# Patient Record
Sex: Male | Born: 1940
Health system: Southern US, Community
[De-identification: ages and names within clinical notes are randomized; demographics above are authoritative.]

## PROBLEM LIST (undated history)

## (undated) DIAGNOSIS — J358 Other chronic diseases of tonsils and adenoids: Secondary | ICD-10-CM

## (undated) DIAGNOSIS — K219 Gastro-esophageal reflux disease without esophagitis: Secondary | ICD-10-CM

## (undated) DIAGNOSIS — I10 Essential (primary) hypertension: Secondary | ICD-10-CM

## (undated) DIAGNOSIS — F1010 Alcohol abuse, uncomplicated: Secondary | ICD-10-CM

## (undated) DIAGNOSIS — N200 Calculus of kidney: Secondary | ICD-10-CM

## (undated) DIAGNOSIS — R51 Headache: Secondary | ICD-10-CM

## (undated) DIAGNOSIS — R0989 Other specified symptoms and signs involving the circulatory and respiratory systems: Secondary | ICD-10-CM

## (undated) DIAGNOSIS — C4491 Basal cell carcinoma of skin, unspecified: Secondary | ICD-10-CM

## (undated) DIAGNOSIS — Z973 Presence of spectacles and contact lenses: Secondary | ICD-10-CM

## (undated) DIAGNOSIS — E209 Hypoparathyroidism, unspecified: Secondary | ICD-10-CM

## (undated) DIAGNOSIS — E785 Hyperlipidemia, unspecified: Secondary | ICD-10-CM

## (undated) DIAGNOSIS — M1990 Unspecified osteoarthritis, unspecified site: Secondary | ICD-10-CM

## (undated) DIAGNOSIS — E039 Hypothyroidism, unspecified: Secondary | ICD-10-CM

## (undated) DIAGNOSIS — I639 Cerebral infarction, unspecified: Secondary | ICD-10-CM

## (undated) DIAGNOSIS — R519 Headache, unspecified: Secondary | ICD-10-CM

## (undated) DIAGNOSIS — N400 Enlarged prostate without lower urinary tract symptoms: Secondary | ICD-10-CM

## (undated) DIAGNOSIS — I6529 Occlusion and stenosis of unspecified carotid artery: Secondary | ICD-10-CM

## (undated) DIAGNOSIS — H269 Unspecified cataract: Secondary | ICD-10-CM

## (undated) DIAGNOSIS — K5792 Diverticulitis of intestine, part unspecified, without perforation or abscess without bleeding: Secondary | ICD-10-CM

## (undated) HISTORY — DX: Benign prostatic hyperplasia without lower urinary tract symptoms: N40.0

## (undated) HISTORY — DX: Gastro-esophageal reflux disease without esophagitis: K21.9

## (undated) HISTORY — DX: Essential (primary) hypertension: I10

## (undated) HISTORY — DX: Other specified symptoms and signs involving the circulatory and respiratory systems: R09.89

## (undated) HISTORY — PX: COLONOSCOPY W/ BIOPSIES AND POLYPECTOMY: SHX1376

## (undated) HISTORY — DX: Hyperlipidemia, unspecified: E78.5

## (undated) HISTORY — DX: Diverticulitis of intestine, part unspecified, without perforation or abscess without bleeding: K57.92

## (undated) HISTORY — DX: Calculus of kidney: N20.0

## (undated) HISTORY — PX: OTHER SURGICAL HISTORY: SHX169

## (undated) HISTORY — DX: Other chronic diseases of tonsils and adenoids: J35.8

## (undated) HISTORY — DX: Hypothyroidism, unspecified: E03.9

## (undated) HISTORY — DX: Basal cell carcinoma of skin, unspecified: C44.91

## (undated) HISTORY — DX: Alcohol abuse, uncomplicated: F10.10

## (undated) HISTORY — PX: TONSILLECTOMY: SUR1361

## (undated) HISTORY — PX: CAROTID ENDARTERECTOMY: SUR193

## (undated) HISTORY — PX: CHOLECYSTECTOMY: SHX55

## (undated) HISTORY — DX: Hypoparathyroidism, unspecified: E20.9

---

## 1985-04-10 HISTORY — PX: HYDROCELE EXCISION / REPAIR: SUR1145

## 1989-04-10 DIAGNOSIS — F1010 Alcohol abuse, uncomplicated: Secondary | ICD-10-CM

## 1989-04-10 HISTORY — DX: Alcohol abuse, uncomplicated: F10.10

## 2000-04-10 DIAGNOSIS — R0989 Other specified symptoms and signs involving the circulatory and respiratory systems: Secondary | ICD-10-CM

## 2000-04-10 HISTORY — DX: Other specified symptoms and signs involving the circulatory and respiratory systems: R09.89

## 2002-04-10 DIAGNOSIS — E209 Hypoparathyroidism, unspecified: Secondary | ICD-10-CM

## 2002-04-10 HISTORY — DX: Hypoparathyroidism, unspecified: E20.9

## 2004-04-10 HISTORY — PX: KIDNEY STONE SURGERY: SHX686

## 2006-09-25 ENCOUNTER — Ambulatory Visit: Payer: Self-pay | Admitting: Internal Medicine

## 2006-09-25 DIAGNOSIS — N400 Enlarged prostate without lower urinary tract symptoms: Secondary | ICD-10-CM

## 2006-09-25 DIAGNOSIS — Z8719 Personal history of other diseases of the digestive system: Secondary | ICD-10-CM | POA: Insufficient documentation

## 2006-09-25 DIAGNOSIS — K219 Gastro-esophageal reflux disease without esophagitis: Secondary | ICD-10-CM

## 2006-09-25 DIAGNOSIS — E785 Hyperlipidemia, unspecified: Secondary | ICD-10-CM | POA: Insufficient documentation

## 2006-09-25 DIAGNOSIS — Z87442 Personal history of urinary calculi: Secondary | ICD-10-CM | POA: Insufficient documentation

## 2006-11-05 ENCOUNTER — Telehealth (INDEPENDENT_AMBULATORY_CARE_PROVIDER_SITE_OTHER): Payer: Self-pay | Admitting: *Deleted

## 2006-12-05 ENCOUNTER — Ambulatory Visit: Payer: Self-pay | Admitting: Internal Medicine

## 2006-12-24 ENCOUNTER — Ambulatory Visit: Payer: Self-pay | Admitting: Internal Medicine

## 2006-12-24 LAB — CONVERTED CEMR LAB
Bilirubin Urine: NEGATIVE
Glucose, Urine, Semiquant: NEGATIVE
Protein, U semiquant: NEGATIVE
Specific Gravity, Urine: >=1.03
Urobilinogen, UA: NEGATIVE
WBC Urine, dipstick: NEGATIVE

## 2006-12-25 LAB — CONVERTED CEMR LAB
AST: 22 units/L (ref 0–37)
Free T4: 0.9 ng/dL (ref 0.6–1.6)
HDL: 41.8 mg/dL (ref >39.0)
LDL Cholesterol: 107 mg/dL — ABNORMAL HIGH (ref 0–99)
PSA: 0.86 ng/mL (ref 0.10–4.00)
VLDL: 28 mg/dL (ref 0–40)

## 2007-05-13 ENCOUNTER — Ambulatory Visit: Payer: Self-pay | Admitting: Internal Medicine

## 2007-06-24 ENCOUNTER — Ambulatory Visit: Payer: Self-pay | Admitting: Internal Medicine

## 2007-06-24 DIAGNOSIS — E039 Hypothyroidism, unspecified: Secondary | ICD-10-CM

## 2007-06-26 LAB — CONVERTED CEMR LAB: TSH: 1.68 microintl units/mL (ref 0.35–5.50)

## 2007-12-13 ENCOUNTER — Ambulatory Visit: Payer: Self-pay | Admitting: Internal Medicine

## 2007-12-13 DIAGNOSIS — E213 Hyperparathyroidism, unspecified: Secondary | ICD-10-CM

## 2007-12-13 LAB — CONVERTED CEMR LAB
ALT: 30 units/L (ref 0–53)
AST: 30 units/L (ref 0–37)
Basophils Absolute: 0 10*3/uL (ref 0.0–0.1)
Basophils Relative: 0.8 % (ref 0.0–3.0)
CO2: 27 meq/L (ref 19–32)
Calcium: 9.4 mg/dL
Chloride, Serum: 108 mmol/L
Chloride: 108 meq/L (ref 96–112)
Cholesterol: 188 mg/dL
Creatinine, Ser: 1.1 mg/dL
Glucose, Bld: 116 mg/dL
Glucose, Bld: 116 mg/dL — ABNORMAL HIGH (ref 70–99)
HDL: 38.2 mg/dL
HDL: 38.2 mg/dL — ABNORMAL LOW (ref >39.0)
LDL Cholesterol: 61 mg/dL
Lymphocytes Relative: 24.3 % (ref 12.0–46.0)
MCHC: 35.8 g/dL (ref 30.0–36.0)
Monocytes Relative: 8.8 % (ref 3.0–12.0)
Neutrophils Relative %: 64.6 % (ref 43.0–77.0)
PSA: 1.28 ng/mL (ref 0.10–4.00)
RBC: 4.68 M/uL (ref 4.22–5.81)
RDW: 12 % (ref 11.5–14.6)
Sodium: 142 meq/L (ref 135–145)
TSH: 1.04 microintl units/mL
TSH: 1.04 microintl units/mL (ref 0.35–5.50)
Total CHOL/HDL Ratio: 4.9
VLDL: 61 mg/dL — ABNORMAL HIGH (ref 0–40)

## 2007-12-17 ENCOUNTER — Ambulatory Visit: Payer: Self-pay | Admitting: Internal Medicine

## 2007-12-17 LAB — CONVERTED CEMR LAB
Basophils Absolute: 0 10*3/uL (ref 0.0–0.1)
Basophils Relative: 0.8 % (ref 0.0–3.0)
Eosinophils Absolute: 0.1 10*3/uL (ref 0.0–0.7)
Eosinophils Relative: 1.5 % (ref 0.0–5.0)
HCT: 45.2 % (ref 39.0–52.0)
Hemoglobin: 16.2 g/dL (ref 13.0–17.0)
MCHC: 35.8 g/dL (ref 30.0–36.0)
MCV: 96.5 fL (ref 78.0–100.0)
Monocytes Absolute: 0.4 10*3/uL (ref 0.1–1.0)
Neutro Abs: 2.9 10*3/uL (ref 1.4–7.7)
RBC: 4.68 M/uL (ref 4.22–5.81)
WBC: 4.5 10*3/uL (ref 4.5–10.5)

## 2007-12-18 ENCOUNTER — Encounter (INDEPENDENT_AMBULATORY_CARE_PROVIDER_SITE_OTHER): Payer: Self-pay | Admitting: *Deleted

## 2007-12-23 ENCOUNTER — Encounter: Payer: Self-pay | Admitting: Internal Medicine

## 2007-12-24 ENCOUNTER — Telehealth (INDEPENDENT_AMBULATORY_CARE_PROVIDER_SITE_OTHER): Payer: Self-pay | Admitting: *Deleted

## 2007-12-25 ENCOUNTER — Encounter (INDEPENDENT_AMBULATORY_CARE_PROVIDER_SITE_OTHER): Payer: Self-pay | Admitting: *Deleted

## 2008-01-06 ENCOUNTER — Ambulatory Visit: Payer: Self-pay | Admitting: Family Medicine

## 2008-01-06 ENCOUNTER — Encounter: Payer: Self-pay | Admitting: Internal Medicine

## 2008-05-20 ENCOUNTER — Ambulatory Visit: Payer: Self-pay | Admitting: Internal Medicine

## 2008-05-20 ENCOUNTER — Encounter (INDEPENDENT_AMBULATORY_CARE_PROVIDER_SITE_OTHER): Payer: Self-pay | Admitting: *Deleted

## 2008-06-11 ENCOUNTER — Ambulatory Visit: Payer: Self-pay | Admitting: Internal Medicine

## 2008-06-11 DIAGNOSIS — I10 Essential (primary) hypertension: Secondary | ICD-10-CM | POA: Insufficient documentation

## 2008-06-11 DIAGNOSIS — R7309 Other abnormal glucose: Secondary | ICD-10-CM | POA: Insufficient documentation

## 2008-06-16 ENCOUNTER — Telehealth (INDEPENDENT_AMBULATORY_CARE_PROVIDER_SITE_OTHER): Payer: Self-pay | Admitting: *Deleted

## 2008-06-16 LAB — CONVERTED CEMR LAB
Glucose, Bld: 121 mg/dL — ABNORMAL HIGH (ref 70–99)
Hgb A1c MFr Bld: 5.4 % (ref 4.6–6.0)
LDL Cholesterol: 85 mg/dL (ref 0–99)
Total CHOL/HDL Ratio: 4.6
VLDL: 38 mg/dL (ref 0–40)

## 2008-12-18 ENCOUNTER — Ambulatory Visit: Payer: Self-pay | Admitting: Internal Medicine

## 2008-12-23 LAB — CONVERTED CEMR LAB
BUN: 13 mg/dL (ref 6–23)
Calcium: 9.3 mg/dL (ref 8.4–10.5)
Creatinine, Ser: 1.1 mg/dL (ref 0.4–1.5)
GFR calc non Af Amer: 70.38 mL/min (ref >60)
Glucose, Bld: 109 mg/dL — ABNORMAL HIGH (ref 70–99)
HDL: 37.3 mg/dL — ABNORMAL LOW (ref >39.00)
Hemoglobin: 16.4 g/dL (ref 13.0–17.0)
PSA: 1.07 ng/mL (ref 0.10–4.00)

## 2009-03-02 ENCOUNTER — Encounter (INDEPENDENT_AMBULATORY_CARE_PROVIDER_SITE_OTHER): Payer: Self-pay | Admitting: *Deleted

## 2009-06-18 ENCOUNTER — Ambulatory Visit: Payer: Self-pay | Admitting: Internal Medicine

## 2009-07-01 ENCOUNTER — Ambulatory Visit: Payer: Self-pay | Admitting: Internal Medicine

## 2010-01-21 ENCOUNTER — Ambulatory Visit: Payer: Self-pay | Admitting: Internal Medicine

## 2010-01-24 ENCOUNTER — Encounter (INDEPENDENT_AMBULATORY_CARE_PROVIDER_SITE_OTHER): Payer: Self-pay | Admitting: *Deleted

## 2010-01-27 LAB — CONVERTED CEMR LAB
BUN: 13 mg/dL (ref 6–23)
CO2: 21 meq/L (ref 19–32)
Chloride: 108 meq/L (ref 96–112)
Cholesterol: 181 mg/dL (ref 0–200)
Eosinophils Absolute: 0.1 10*3/uL (ref 0.0–0.7)
Eosinophils Relative: 1.3 % (ref 0.0–5.0)
GFR calc non Af Amer: 70.16 mL/min (ref >60)
Glucose, Bld: 98 mg/dL (ref 70–99)
HCT: 46.6 % (ref 39.0–52.0)
HDL: 40.3 mg/dL (ref >39.00)
LDL Cholesterol: 110 mg/dL — ABNORMAL HIGH (ref 0–99)
Lymphs Abs: 1.1 10*3/uL (ref 0.7–4.0)
MCHC: 35.2 g/dL (ref 30.0–36.0)
MCV: 94.7 fL (ref 78.0–100.0)
Monocytes Absolute: 0.4 10*3/uL (ref 0.1–1.0)
Neutrophils Relative %: 65.3 % (ref 43.0–77.0)
Platelets: 131 10*3/uL — ABNORMAL LOW (ref 150.0–400.0)
Potassium: 3.8 meq/L (ref 3.5–5.1)
Sodium: 139 meq/L (ref 135–145)
TSH: 3.69 microintl units/mL (ref 0.35–5.50)
Triglycerides: 152 mg/dL — ABNORMAL HIGH (ref 0.0–149.0)
VLDL: 30.4 mg/dL (ref 0.0–40.0)

## 2010-04-12 ENCOUNTER — Encounter (INDEPENDENT_AMBULATORY_CARE_PROVIDER_SITE_OTHER): Payer: Self-pay | Admitting: *Deleted

## 2010-05-06 ENCOUNTER — Ambulatory Visit
Admission: RE | Admit: 2010-05-06 | Discharge: 2010-05-06 | Payer: Self-pay | Source: Home / Self Care | Attending: Internal Medicine | Admitting: Internal Medicine

## 2010-05-06 DIAGNOSIS — J069 Acute upper respiratory infection, unspecified: Secondary | ICD-10-CM | POA: Insufficient documentation

## 2010-05-09 ENCOUNTER — Encounter (INDEPENDENT_AMBULATORY_CARE_PROVIDER_SITE_OTHER): Payer: Self-pay

## 2010-05-10 ENCOUNTER — Ambulatory Visit
Admission: RE | Admit: 2010-05-10 | Discharge: 2010-05-10 | Payer: Self-pay | Source: Home / Self Care | Attending: Gastroenterology | Admitting: Gastroenterology

## 2010-05-11 NOTE — Letter (Signed)
Summary: Pre Visit Letter Revised  [facility]  [address] [place], [place] [address]   Phone: [phone]  Fax: [phone]        01/24/2010 MRN: [id number] Damon Foley [address] [place], [place]  [address]             Procedure Date:  03-08-10   Welcome to the Gastroenterology Division at [facility].    You are scheduled to see a nurse for your pre-procedure visit on 02-22-10 at 10:00a.m. on the 3rd floor at [facility], [address].  We ask that you try to arrive at our office 15 minutes prior to your appointment time to allow for check-in.  Please take a minute to review the attached form.  If you answer "Yes" to one or more of the questions on the first page, we ask that you call the person listed at your earliest opportunity.  If you answer "No" to all of the questions, please complete the rest of the form and bring it to your appointment.    Your nurse visit will consist of discussing your medical and surgical history, your immediate family medical history, and your medications.   If you are unable to list all of your medications on the form, please bring the medication bottles to your appointment and we will list them.  We will need to be aware of both prescribed and over the counter drugs.  We will need to know exact dosage information as well.    Please be prepared to read and sign documents such as consent forms, a financial agreement, and acknowledgement forms.  If necessary, and with your consent, a friend or relative is welcome to sit-in on the nurse visit with you.  Please bring your insurance card so that we may make a copy of it.  If your insurance requires a referral to see a specialist, please bring your referral form from your primary care physician.  No co-pay is required for this nurse visit.     If you cannot keep your appointment, please call [phone] to cancel or reschedule prior to your appointment  date.  This allows [place] the opportunity to schedule an appointment for another patient in need of care.    Thank you for choosing [facility] for your medical needs.  We appreciate the opportunity to care for you.  Please visit [place] at our website  to learn more about our practice.  Sincerely, The Gastroenterology Division

## 2010-05-11 NOTE — Assessment & Plan Note (Signed)
Summary: ear wax/swh   Vital Signs:  Patient profile:   70 year old male Height:      71 inches Weight:      225.4 pounds BMI:     31.55 BP sitting:   130 / 74  Vitals Entered By: Shary Decamp (July 01, 2009 9:24 AM) CC: pt was fitted for hearing aids yesterday -- ear wax left ear Comments rt ear - clear lots of wax in left ear -- irrigated left ear  pt has no concerns Shary Decamp  July 01, 2009 9:25 AM    Allergies: No Known Drug Allergies   Complete Medication List: 1)  Levoxyl 125 Mcg Tabs (Levothyroxine sodium) .Marland Kitchen.. 1 by mouth once daily 2)  Atenolol 50 Mg Tabs (Atenolol) .Marland Kitchen.. 1 by mouth once daily 3)  Simvastatin 40 Mg Tabs (Simvastatin) .Marland Kitchen.. 1 by mouth once daily 4)  Ranitidine Hcl 150 Mg Caps (Ranitidine hcl) .Marland Kitchen.. 1 by mouth once daily as needed 5)  Uroxatral 10 Mg Tb24 (Alfuzosin hcl) .... One p.o. by mouth daily  Other Orders: Cerumen Impaction Removal (11914)

## 2010-05-11 NOTE — Assessment & Plan Note (Signed)
Summary: cpx/kdc   Vital Signs:  Patient profile:   70 year old male Height:      71 inches Weight:      238.50 pounds Pulse rate:   82 / minute Pulse rhythm:   regular BP sitting:   128 / 82  (left arm) Cuff size:   large  Vitals Entered By: Army Fossa CMA (January 21, 2010 12:42 PM) CC: CPX, fasting (had coffee only)  Comments will get flu shot @ work Wants rx's printed.    History of Present Illness: Here for Medicare AWV:  1.   Risk factors based on Past M, S, F history: reviewed  2.   Physical Activities: works x3 /week, active , no routine exercise  3.   Depression/mood:  no problems, no issues noted  4.   Hearing: has hearing aids, helping a lot  5.   ADL's: totally independent 6.   Fall Risk: no recent falls, low risk  7.   Home Safety: feels safe at home  8.   Height, weight, &visual acuity: see VS,  vision wel corrected w/  glasses  9.   Counseling: yes  10.   Labs ordered based on risk factors: yes  11.           Referral Coordination, if needed  12.           Care Plan, see a/p  13.            Cognitive Assessment , motor skills, memory and cognition seems within normal  in addition, we discussed the following issues  thyroid, due for labs  HTN -- no ambulatory BPs , good medication compliance  Hyperlipidemia-- good medication compliance, no s/e -myalgias that he can tell Benign prostatic hypertrophy-- nocturia x 2 , for years, symptoms stable     Current Medications (verified): 1)  Levoxyl 125 Mcg  Tabs (Levothyroxine Sodium) .Marland Kitchen.. 1 By Mouth Once Daily 2)  Atenolol 50 Mg  Tabs (Atenolol) .Marland Kitchen.. 1 By Mouth Once Daily 3)  Simvastatin 40 Mg  Tabs (Simvastatin) .Marland Kitchen.. 1 By Mouth Once Daily 4)  Ranitidine Hcl 150 Mg  Caps (Ranitidine Hcl) .Marland Kitchen.. 1 By Mouth Once Daily As Needed 5)  Uroxatral 10 Mg  Tb24 (Alfuzosin Hcl) .... One P.o. By Mouth Daily  Allergies (verified): No Known Drug Allergies  Past History:  Past Medical History: Reviewed history from  06/18/2009 and no changes required. HYPOTHYROIDISM (DX aprox 2004) HTN  GERD Hyperlipidemia Benign prostatic hypertrophy DIVERTICULITIS  h/o hypoparathyroidism RENAL CALCULUS, R sided 2006 h/o ETOH abuse quit 1991 per chart review: R carotid bruit 2002, neg u/s  Past Surgical History: Reviewed history from 12/13/2007 and no changes required. Cholecystectomy Right kidney stone removed - had stent - did not work -  had stone removed 2006 L hydrocele repair 1987  Family History: DM--no MI-- M age 40 colon ca-- no prostate ca-- F age 76 cancer, type? (brother)  Social History: Divorced, lives by self  has one daughter in [place] to [place] in May 2008 from [place] Former Smoker (quit 04/1989) Alcohol use-no (quit 04/1989) Drug use-no Regular exercise-no  Review of Systems General:  Denies fever and weight loss. CV:  Denies chest pain or discomfort and swelling of feet. Resp:  Denies cough and shortness of breath. GI:  Denies bloody stools, diarrhea, nausea, and vomiting. GU:  Denies dysuria and hematuria.  Physical Exam  General:  alert, well-developed, and well-nourished.   Neck:  full ROM, no  masses, no thyromegaly, and normal carotid upstroke.   Lungs:  normal respiratory effort, no intercostal retractions, no accessory muscle use, and normal breath sounds.   Heart:  normal rate, regular rhythm, and no murmur.   Abdomen:  soft, non-tender, no distention, no masses, no guarding, and no rigidity.   Rectal:  No external abnormalities noted. Normal sphincter tone. No rectal masses or tenderness. Prostate:  Prostate gland firm and smooth, no enlargement, nodularity, tenderness, mass, asymmetry or induration. Extremities:  no pretibial edema bilaterally  Psych:  Oriented X3, memory intact for recent and remote, normally interactive, good eye contact, not anxious appearing, and not depressed appearing.     Impression & Recommendations:  Problem # 1:  HEALTH SCREENING  (ICD-V70.0)  TD 07 Pneumonia shot 08 shingles shot? 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 1 by mouth once daily  BP today: 128/82 Prior BP: 130/74 (07/01/2009)  Labs Reviewed: K+: 4.1 (12/18/2008) Creat: : 1.1 (12/18/2008)   Chol: 167 (12/18/2008)   HDL: 37.30 (12/18/2008)   LDL: 85 (06/11/2008)   TG: 229.0 (12/18/2008)  Orders: Venipuncture (60[address]) TLB-BMP (Basic Metabolic Panel-BMET) (80048-METABOL) TLB-CBC Platelet - w/Differential (85025-CBCD) Specimen Handling (09811)  Problem # 3:  Hx of HYPERPARATHYROIDISM UNSPECIFIED (ICD-252.00) recheck a calcium level, PTH if calcium is abnormal  Problem # 4:  HYPERLIPIDEMIA (ICD-272.4) labs  His updated medication list for this problem includes:    Simvastatin 40 Mg Tabs (Simvastatin) .Marland Kitchen... 1 by mouth once daily  Labs Reviewed: SGOT: 22 (12/18/2008)   SGPT: 22 (12/18/2008)   HDL:37.30 (12/18/2008), 34.1 (06/11/2008)  LDL:85 (06/11/2008), 61 (12/13/2007)  Chol:167 (12/18/2008), 157 (06/11/2008)  Trig:229.0 (12/18/2008), 191 (06/11/2008)  Orders: TLB-ALT (SGPT) (84460-ALT) TLB-AST (SGOT) (84450-SGOT) TLB-Lipid Panel (80061-LIPID) Specimen Handling (91478)  Problem # 5:  HYPOTHYROIDISM (ICD-244.9) due for labs His updated medication list for this problem includes:    Levoxyl 125 Mcg Tabs (Levothyroxine sodium) .Marland Kitchen... 1 by mouth once  daily    Labs Reviewed: TSH: 1.40 (12/18/2008)    HgBA1c: 5.4 (06/11/2008) Chol: 167 (12/18/2008)   HDL: 37.30 (12/18/2008)   LDL: 85 (06/11/2008)   TG: 229.0 (12/18/2008)  Orders: TLB-TSH (Thyroid Stimulating Hormone) (84443-TSH)  Complete Medication List: 1)  Levoxyl 125 Mcg Tabs (Levothyroxine sodium) .Marland Kitchen.. 1 by mouth once daily 2)  Atenolol 50 Mg Tabs (Atenolol) .Marland Kitchen.. 1 by mouth once daily 3)  Simvastatin 40 Mg Tabs (Simvastatin) .Marland Kitchen.. 1 by mouth once daily 4)  Ranitidine Hcl 150 Mg Caps (Ranitidine hcl) .Marland Kitchen.. 1 by mouth once daily as needed 5)  Uroxatral 10 Mg Tb24 (Alfuzosin hcl) .... One p.o. by mouth daily  Other Orders: TLB-PSA (Prostate Specific Antigen) (84153-PSA)  Patient Instructions: 1)  Please schedule a follow-up appointment in 6 months .  Prescriptions: UROXATRAL 10 MG  TB24 (ALFUZOSIN HCL) one p.o. by mouth daily  #90 x 3   Entered by:   Army Fossa CMA   Authorized  by:   Nolon Rod Paz MD   Signed by:   Army Fossa CMA on 01/21/2010   Method used:   Print then Give to Patient   RxID:   [id number] RANITIDINE HCL 150 MG  CAPS (RANITIDINE HCL) 1 by mouth once daily as needed  #90 x 3   Entered by:   Army Fossa CMA   Authorized by:   Nolon Rod. Paz MD   Signed by:   Army Fossa CMA on 01/21/2010   Method used:   Print then Give to Patient   RxID:   [id number] SIMVASTATIN 40 MG  TABS (SIMVASTATIN) 1 by mouth once daily  #90 x 3   Entered by:   Army Fossa CMA   Authorized by:   Nolon Rod. Paz MD   Signed by:   Army Fossa CMA on 01/21/2010   Method used:   Print then Give to Patient   RxID:   [id number] ATENOLOL 50 MG  TABS (ATENOLOL) 1 by mouth once daily  #90 x 3   Entered by:   Army Fossa CMA   Authorized by:   Nolon Rod. Paz MD   Signed by:   Army Fossa CMA on 01/21/2010   Method used:   Print then Give to Patient   RxID:   [id number] LEVOXYL 125 MCG  TABS (LEVOTHYROXINE SODIUM) 1 by mouth once daily  #90 x  3   Entered by:   Army Fossa CMA   Authorized by:   Nolon Rod. Paz MD   Signed by:   Army Fossa CMA on 01/21/2010   Method used:   Print then Give to Patient   RxID:   [id number]

## 2010-05-11 NOTE — Assessment & Plan Note (Signed)
Summary: 6 MONTH OV//PH   Vital Signs:  Patient profile:   70 year old male Weight:      225 pounds Pulse rate:   64 / minute BP sitting:   138 / 80  (left arm)  Vitals Entered By: Doristine Devoid (June 18, 2009 9:48 AM) CC: 6 month roa    History of Present Illness: ROV  doing well complaining of  difficulties w/ his hearing  Allergies: No Known Drug Allergies  Past History:  Past Medical History: HYPOTHYROIDISM (DX aprox 2004) HTN  GERD Hyperlipidemia Benign prostatic hypertrophy DIVERTICULITIS  h/o hypoparathyroidism RENAL CALCULUS, R sided 2006 h/o ETOH abuse quit 1991 per chart review: R carotid bruit 2002, neg u/s  Past Surgical History: Reviewed history from 12/13/2007 and no changes required. Cholecystectomy Right kidney stone removed - had stent - did not work -  had stone removed 2006 L hydrocele repair 1987  Social History: Reviewed history from 12/18/2008 and no changes required. Divorced has one daughter. Moved to [facility] in May 2008 from [place] Former Smoker (quit 04/1989) Alcohol use-no (quit 04/1989) Drug use-no Regular exercise-no  Review of Systems        HYPOTHYROIDISM  -- good medication compliance  HTN -- no ambulatory BPs ,has decrerased his  salt intake Hyperlipidemia-- good medication compliance     Physical Exam  General:  alert, well-developed, and well-nourished.   Lungs:  normal respiratory effort, no intercostal retractions, no accessory muscle use, and normal breath sounds.   Heart:  normal rate, regular rhythm, and no murmur.   Extremities:  no pretibial edema bilaterally    Impression & Recommendations:  Problem # 1:  HYPERTENSION (ICD-401.9) at goal  His updated medication list for this problem includes:    Atenolol 50 Mg Tabs (Atenolol) .Marland Kitchen... 1 by mouth once daily  BP today: 138/80 Prior BP: 134/80 (12/18/2008)  Labs Reviewed: K+: 4.1 (12/18/2008) Creat: : 1.1 (12/18/2008)   Chol: 167 (12/18/2008)   HDL:  37.30 (12/18/2008)   LDL: 85 (06/11/2008)   TG: 229.0 (12/18/2008)  Problem # 2:  HYPERLIPIDEMIA (ICD-272.4) I reviewed with the patient  his previous  FLPs, they are okay except for his last triglycerides. Information provided about  healthy diet His updated medication list for this problem includes:    Simvastatin 40 Mg Tabs (Simvastatin) .Marland Kitchen... 1 by mouth once daily  Labs Reviewed: SGOT: 22 (12/18/2008)   SGPT: 22 (12/18/2008)   HDL:37.30 (12/18/2008), 34.1 (06/11/2008)  LDL:85 (06/11/2008), 61 (12/13/2007)  Chol:167 (12/18/2008), 157 (06/11/2008)  Trig:229.0 (12/18/2008), 191 (06/11/2008)  Problem # 3:  HYPOTHYROIDISM (ICD-244.9) RF meds  His updated medication list for this problem includes:    Levoxyl 125 Mcg Tabs (Levothyroxine sodium) .Marland Kitchen... 1 by mouth once daily  Problem # 4:  decreased hearing counseled, recommend to contact "the hearing clinic"  Complete Medication List: 1)  Levoxyl 125 Mcg Tabs (Levothyroxine sodium) .Marland Kitchen.. 1 by mouth once daily 2)  Atenolol 50 Mg Tabs (Atenolol) .Marland Kitchen.. 1 by mouth once daily 3)  Simvastatin 40 Mg Tabs (Simvastatin) .Marland Kitchen.. 1 by mouth once daily 4)  Ranitidine Hcl 150 Mg Caps (Ranitidine hcl) .Marland Kitchen.. 1 by mouth once daily as needed 5)  Uroxatral 10 Mg Tb24 (Alfuzosin hcl) .... One p.o. by mouth daily  Patient Instructions: 1)  Please schedule a follow-up appointment in 6 months (yearly check up, fasting) Prescriptions: UROXATRAL 10 MG  TB24 (ALFUZOSIN HCL) one p.o. by mouth daily  #90 x 3   Entered and Authorized by:  Rockwell Zentz E. Andreas Sobolewski MD   Signed by:   Nolon Rod. Mikayela Deats MD on 06/18/2009   Method used:   Print then Give to Patient   RxID:   [id number] RANITIDINE HCL 150 MG  CAPS (RANITIDINE HCL) 1 by mouth once daily as needed  #90 x 3   Entered and Authorized by:   Elita Quick E. Mynor Witkop MD   Signed by:   Nolon Rod. Araly Kaas MD on 06/18/2009   Method used:   Print then Give to Patient   RxID:   [id number] SIMVASTATIN 40 MG  TABS (SIMVASTATIN) 1 by mouth once  daily  #90 x 3   Entered and Authorized by:   Nolon Rod. Priest Lockridge MD   Signed by:   Nolon Rod. Lovina Zuver MD on 06/18/2009   Method used:   Print then Give to Patient   RxID:   [id number] ATENOLOL 50 MG  TABS (ATENOLOL) 1 by mouth once daily  #90 x 3   Entered and Authorized by:   Nolon Rod. Tima Curet MD   Signed by:   Nolon Rod. Jadarius Commons MD on 06/18/2009   Method used:   Print then Give to Patient   RxID:   [id number] LEVOXYL 125 MCG  TABS (LEVOTHYROXINE SODIUM) 1 by mouth once daily  #90 x 3   Entered and Authorized by:   Nolon Rod. Jaylee Freeze MD   Signed by:   Nolon Rod. Kelten Enochs MD on 06/18/2009   Method used:   Print then Give to Patient   RxID:   [id number]

## 2010-05-12 NOTE — Letter (Signed)
Summary: Pre Visit Letter Revised  [facility]  [address] [place], [place] [address]   Phone: [phone]  Fax: [phone]        04/12/2010 MRN: [id number] Damon Foley [address] [place], [place]  [address]             Procedure Date:  05/24/2010 @ 11:30   Direct colon-Dr. Christella Hartigan   Welcome to the Gastroenterology Division at [facility].    You are scheduled to see a nurse for your pre-procedure visit on 05/10/2010 at 11:00 am on the 3rd floor at [facility], [address].  We ask that you try to arrive at our office 15 minutes prior to your appointment time to allow for check-in.  Please take a minute to review the attached form.  If you answer "Yes" to one or more of the questions on the first page, we ask that you call the person listed at your earliest opportunity.  If you answer "No" to all of the questions, please complete the rest of the form and bring it to your appointment.    Your nurse visit will consist of discussing your medical and surgical history, your immediate family medical history, and your medications.   If you are unable to list all of your medications on the form, please bring the medication bottles to your appointment and we will list them.  We will need to be aware of both prescribed and over the counter drugs.  We will need to know exact dosage information as well.    Please be prepared to read and sign documents such as consent forms, a financial agreement, and acknowledgement forms.  If necessary, and with your consent, a friend or relative is welcome to sit-in on the nurse visit with you.  Please bring your insurance card so that we may make a copy of it.  If your insurance requires a referral to see a specialist, please bring your referral form from your primary care physician.  No co-pay is required for this nurse visit.     If you cannot keep your appointment, please call [phone] to cancel or  reschedule prior to your appointment date.  This allows [place] the opportunity to schedule an appointment for another patient in need of care.    Thank you for choosing [facility] for your medical needs.  We appreciate the opportunity to care for you.  Please visit [place] at our website  to learn more about our practice.  Sincerely, The Gastroenterology Division

## 2010-05-18 NOTE — Miscellaneous (Signed)
Summary: Lec previsit  Clinical Lists Changes  Medications: Added new medication of MOVIPREP 100 GM  SOLR (PEG-KCL-NACL-NASULF-NA ASC-C) As per prep instructions. - Signed Rx of MOVIPREP 100 GM  SOLR (PEG-KCL-NACL-NASULF-NA ASC-C) As per prep instructions.;  #1 x 0;  Signed;  Entered by: Ulis Rias RN;  Authorized by: Rachael Fee MD;  Method used: Electronically to [id number] [address] [place], [place]  [address], Ph: [phone] or [phone], Fax: [phone] Observations: Added new observation of NKA: T (05/10/2010 10:43)    Prescriptions: MOVIPREP 100 GM  SOLR (PEG-KCL-NACL-NASULF-NA ASC-C) As per prep instructions.  #1 x 0   Entered by:   Ulis Rias RN   Authorized by:   Rachael Fee MD   Signed by:   Ulis Rias RN on 05/10/2010   Method used:   Electronically to        [facility] (retail)       [address]       [place], [place]  [address]       Ph: [phone] or [phone]       Fax: [phone]   RxID:   [id number]

## 2010-05-18 NOTE — Letter (Signed)
Summary: [facility] Instructions  [facility]  [address] [place], [place] [address]   Phone: [phone]  Fax: [phone]       Damon Foley    [birth year]    MRN: [id number]        Procedure Day /Date: Tuesday 05-24-10     Arrival Time: 10:30 am     Procedure Time: 11:30 am     Location of Procedure:                    _x _  [facility] (4th Floor)  PREPARATION FOR COLONOSCOPY WITH MOVIPREP   Starting 5 days prior to your procedure  05-19-10 do not eat nuts, seeds, popcorn, corn, beans, peas,  salads, or any raw vegetables.  Do not take any fiber supplements (e.g. Metamucil, Citrucel, and Benefiber).  THE DAY BEFORE YOUR PROCEDURE         DATE:  05-23-10  DAY:  Monday   1.  Drink clear liquids the entire day-NO SOLID FOOD  2.  Do not drink anything colored red or purple.  Avoid juices with pulp.  No orange juice.  3.  Drink at least 64 oz. (8 glasses) of fluid/clear liquids during the day to prevent dehydration and help the prep work efficiently.  CLEAR LIQUIDS INCLUDE: Water Jello Ice Popsicles Tea (sugar ok, no milk/cream) Powdered fruit flavored drinks Coffee (sugar ok, no milk/cream) Gatorade Juice: apple, white grape, white cranberry  Lemonade Clear bullion, consomm, broth Carbonated beverages (any kind) Strained chicken noodle soup Hard Candy                             4.  In the morning, mix first dose of MoviPrep solution:    Empty 1 Pouch A and 1 Pouch B into the disposable container    Add lukewarm drinking water to the top line of the container. Mix to dissolve    Refrigerate (mixed solution should be used within 24 hrs)  5.  Begin drinking the prep at 5:00 p.m. The MoviPrep container is divided by 4 marks.   Every 15 minutes drink the solution down to the next mark (approximately 8 oz) until the full liter is complete.   6.  Follow completed prep with 16 oz of clear liquid of your choice (Nothing red or  purple).  Continue to drink clear liquids until bedtime.  7.  Before going to bed, mix second dose of MoviPrep solution:    Empty 1 Pouch A and 1 Pouch B into the disposable container    Add lukewarm drinking water to the top line of the container. Mix to dissolve    Refrigerate  THE DAY OF YOUR PROCEDURE      DATE:  05-24-10  DAY: Tuesday  Beginning at  6:30 a.m. (5 hours before procedure):         1. Every 15 minutes, drink the solution down to the next mark (approx 8 oz) until the full liter is complete.  2. Follow completed prep with 16 oz. of clear liquid of your choice.    3. You may drink clear liquids until  9:30 a.m.  (2 HOURS BEFORE PROCEDURE).   MEDICATION INSTRUCTIONS  Unless otherwise instructed, you should take regular prescription medications with a small sip of water   as early as possible the morning of your procedure.         OTHER INSTRUCTIONS  You will need a responsible adult at least 72 years of age to accompany you and drive you home.   This person must remain in the waiting room during your procedure.  Wear loose fitting clothing that is easily removed.  Leave jewelry and other valuables at home.  However, you may wish to bring a book to read or  an iPod/MP3 player to listen to music as you wait for your procedure to start.  Remove all body piercing jewelry and leave at home.  Total time from sign-in until discharge is approximately 2-3 hours.  You should go home directly after your procedure and rest.  You can resume normal activities the  day after your procedure.  The day of your procedure you should not:   Drive   Make legal decisions   Operate machinery   Drink alcohol   Return to work  You will receive specific instructions about eating, activities and medications before you leave.    The above instructions have been reviewed and explained to me by   Ulis Rias RN  May 10, 2010 11:24 AM     I fully understand and  can verbalize these instructions _____________________________ Date _________

## 2010-05-18 NOTE — Assessment & Plan Note (Signed)
Summary: COUGH & HOARSENESS/RH......   Vital Signs:  Patient profile:   70 year old male Height:      71 inches Weight:      236.25 pounds BMI:     33.07 Temp:     98.2 degrees F oral Pulse rate:   80 / minute Pulse rhythm:   regular BP sitting:   136 / 84  (left arm) Cuff size:   regular  Vitals Entered By: Army Fossa CMA (May 06, 2010 11:00 AM) CC: Pt here c/o chest congestion, and hoarsness Comments started yesterday [facility] [address]    History of Present Illness: symptoms the started yesterday, chest congestion, mild cough, hoarseness. no sputum   Current Medications (verified): 1)  Levoxyl 125 Mcg  Tabs (Levothyroxine Sodium) .Marland Kitchen.. 1 By Mouth Once Daily 2)  Atenolol 50 Mg  Tabs (Atenolol) .Marland Kitchen.. 1 By Mouth Once Daily 3)  Simvastatin 40 Mg  Tabs (Simvastatin) .Marland Kitchen.. 1 By Mouth Once Daily 4)  Ranitidine Hcl 150 Mg  Caps (Ranitidine Hcl) .Marland Kitchen.. 1 By Mouth Once Daily As Needed 5)  Uroxatral 10 Mg  Tb24 (Alfuzosin Hcl) .... One P.o. By Mouth Daily  Allergies (verified): No Known Drug Allergies  Past History:  Past Medical History: Reviewed history from 06/18/2009 and no changes required. HYPOTHYROIDISM (DX aprox 2004) HTN  GERD Hyperlipidemia Benign prostatic hypertrophy DIVERTICULITIS  h/o hypoparathyroidism RENAL CALCULUS, R sided 2006 h/o ETOH abuse quit 1991 per chart review: R carotid bruit 2002, neg u/s  Past Surgical History: Reviewed history from 12/13/2007 and no changes required. Cholecystectomy Right kidney stone removed - had stent - did not work -  had stone removed 2006 L hydrocele repair 1987  Review of Systems General:  Denies fever and loss of appetite. ENT:  Denies sinus pressure and sore throat. CV:  no substernal chest pain. Resp:  Denies wheezing. GI:  Denies diarrhea, nausea, and vomiting. MS:  Denies muscle aches.  Physical Exam  General:  alert and well-developed.  no apparent distressalert and well-developed.   Ears:  R ear  normal and L ear normal.  R ear normal and L ear normal.   Nose:  no congestion Mouth:  no redness or discharge Lungs:  normal respiratory effort, no intercostal retractions, no accessory muscle use, and normal breath sounds.   Heart:  normal rate, regular rhythm, and no murmur.      Impression & Recommendations:  Problem # 1:  URI (ICD-465.9) see instructions   Complete Medication List: 1)  Levoxyl 125 Mcg Tabs (Levothyroxine sodium) .Marland Kitchen.. 1 by mouth once daily 2)  Atenolol 50 Mg Tabs (Atenolol) .Marland Kitchen.. 1 by mouth once daily 3)  Simvastatin 40 Mg Tabs (Simvastatin) .Marland Kitchen.. 1 by mouth once daily 4)  Ranitidine Hcl 150 Mg Caps (Ranitidine hcl) .Marland Kitchen.. 1 by mouth once daily as needed 5)  Uroxatral 10 Mg Tb24 (Alfuzosin hcl) .... One p.o. by mouth daily  Patient Instructions: 1)  rest, fluids, tylenol 2)  mucinex DM 3)  call if no better in 1 week 4)  call if symptoms severe, high fever, short of breath   Orders Added: 1)  Est. Patient Level III [[address]]

## 2010-05-24 ENCOUNTER — Other Ambulatory Visit (AMBULATORY_SURGERY_CENTER): Payer: Medicare Other | Admitting: Gastroenterology

## 2010-05-24 ENCOUNTER — Other Ambulatory Visit: Payer: Self-pay | Admitting: Gastroenterology

## 2010-05-24 DIAGNOSIS — D126 Benign neoplasm of colon, unspecified: Secondary | ICD-10-CM

## 2010-05-24 DIAGNOSIS — Z1211 Encounter for screening for malignant neoplasm of colon: Secondary | ICD-10-CM

## 2010-05-24 DIAGNOSIS — K5730 Diverticulosis of large intestine without perforation or abscess without bleeding: Secondary | ICD-10-CM

## 2010-05-24 LAB — HM COLONOSCOPY

## 2010-05-31 ENCOUNTER — Encounter: Payer: Self-pay | Admitting: Gastroenterology

## 2010-06-01 NOTE — Procedures (Addendum)
Summary: Colonoscopy  Patient: Damon Foley Note: All result statuses are Final unless otherwise noted.  Tests: (1) Colonoscopy (COL)   COL Colonoscopy           DONE     [facility]     [address]     [place], [place]  [address]           COLONOSCOPY PROCEDURE REPORT           PATIENT:  Foley, Damon  MR#:  [id number]     BIRTHDATE:  [birth year], 69 yrs. old  GENDER:  male     ENDOSCOPIST:  Rachael Fee, MD     REF. BY:  Willow Ora, M.D.     PROCEDURE DATE:  05/24/2010     PROCEDURE:  Colonoscopy with snare polypectomy     ASA CLASS:  Class II     INDICATIONS:  Routine Risk Screening     MEDICATIONS:  Fentanyl 75 mcg IV, Versed 8 mg IV           DESCRIPTION OF PROCEDURE:   After the risks benefits and     alternatives of the procedure were thoroughly explained, informed     consent was obtained.  Digital rectal exam was performed and     revealed no rectal masses.   The LB [id number]-[id number] endoscope     was introduced through the anus and advanced to the cecum, which     was identified by both the appendix and ileocecal valve, without     limitations.  The quality of the prep was good, using MoviPrep.     The instrument was then slowly withdrawn as the colon was fully     examined.     <<PROCEDUREIMAGES>>     FINDINGS:  Three sessile polyps were found, all were removed with     cold snare and all were sent to pathology (jar 3). These were     4-36mm across, located in descending colon segment (see image3).     Mild diverticulosis was found in the sigmoid to descending colon     segments (see image1, image2, and image4).   Retroflexed views in     the rectum revealed no abnormalities.    The scope was then     withdrawn from the patient and the procedure completed.     COMPLICATIONS:  None     ENDOSCOPIC IMPRESSION:     1)Three subcentimeter polyps, all removed and sent to pathology           2) Mild diverticulosis in the sigmoid to descending  colon     segments           RECOMMENDATIONS:     1) If the polyps removed today are proven to be adenomatous     (pre-cancerous) polyps, you will need a colonoscopy in 3-5 years.     Otherwise you should continue to follow colorectal cancer     screening guidelines for "routine risk" patients with a     colonoscopy in 10 years.     2) You will receive a letter within 1-2 weeks with the results     of your biopsy as well as final recommendations. Please call my     office if you have not received a letter after 3 weeks.           ______________________________     Rachael Fee, MD  n.     eSIGNED:   Rachael Fee at 05/24/2010 11:51 AM           Alba Cory, [id number]  Note: An exclamation mark (!) indicates a result that was not dispersed into the flowsheet. Document Creation Date: 05/24/2010 11:52 AM _______________________________________________________________________  (1) Order result status: Final Collection or observation date-time: 05/24/2010 11:42 Requested date-time:  Receipt date-time:  Reported date-time:  Referring Physician:   Ordering Physician: Rob Bunting [phone]) Specimen Source:  Source: Launa Grill Order Number: [id number] Lab site:   Appended Document: Colonoscopy     Procedures Next Due Date:    Colonoscopy: 05/2013

## 2010-06-04 ENCOUNTER — Encounter: Payer: Self-pay | Admitting: Internal Medicine

## 2010-06-07 NOTE — Letter (Signed)
Summary: Results Letter  [facility]  [address] [place], [place] [address]   Phone: [phone]  Fax: [phone]        May 31, 2010 MRN: [id number]    Damon Foley [address] [place], [place]  [address]    Dear Mr. Isaacks,   The polyp(s) removed during your recent procedure were proven to be adenomatous.  These are pre-cancerous polyps that may have grown into cancers if they had not been removed.  Based on current nationally recognized surveillance guidelines, I recommend that you have a repeat colonoscopy in 3 years.   We will therefore put your information in our reminder system and will contact you in 3 years to schedule a repeat procedure.  Please call if you have any questions or concerns.       Sincerely,  Rachael Fee MD  This letter has been electronically signed by your physician.  Appended Document: Results Letter letter mailed

## 2010-07-25 ENCOUNTER — Ambulatory Visit: Payer: Self-pay | Admitting: Internal Medicine

## 2010-08-19 ENCOUNTER — Encounter: Payer: Self-pay | Admitting: Internal Medicine

## 2010-08-19 ENCOUNTER — Ambulatory Visit (INDEPENDENT_AMBULATORY_CARE_PROVIDER_SITE_OTHER): Payer: Medicare Other | Admitting: Internal Medicine

## 2010-08-19 VITALS — BP 136/84 | HR 54 | Wt 237.2 lb

## 2010-08-19 DIAGNOSIS — E039 Hypothyroidism, unspecified: Secondary | ICD-10-CM

## 2010-08-19 DIAGNOSIS — I10 Essential (primary) hypertension: Secondary | ICD-10-CM

## 2010-08-19 DIAGNOSIS — E785 Hyperlipidemia, unspecified: Secondary | ICD-10-CM

## 2010-08-19 LAB — TSH: TSH: 1.73 u[IU]/mL (ref 0.35–5.50)

## 2010-08-19 NOTE — Assessment & Plan Note (Signed)
No change, BP wnl today

## 2010-08-19 NOTE — Progress Notes (Signed)
  Subjective:    Patient ID: Damon Foley, male    DOB: [birth year], 70 y.o.   MRN: [id number]  HPI ROV, doing well , had a cscope 05-2010, next 3 years  Past Medical History  Diagnosis Date  . Hypoparathyroidism   . ETOH abuse 1991    quit  . Carotid bruit 2992    per chart review, right, neg u/s   Past Surgical History  Procedure Date  . Cholecystectomy   . Kidney stone surgery 2006    right kidney stone removed, had stent, did not work,  . Hydrocele excision / repair 1987     Review of Systems No CP-SOB Good med compliance No myalgias, fatigue Diet healthy on-off    Objective:   Physical Exam  Constitutional: He is oriented to person, place, and time. He appears well-developed and well-nourished. No distress.  HENT:  Head: Normocephalic and atraumatic.  Cardiovascular: Normal rate, regular rhythm and normal heart sounds.   Pulmonary/Chest: Effort normal and breath sounds normal. No respiratory distress. He has no rales.  Neurological: He is alert and oriented to person, place, and time.  Psychiatric: He has a normal mood and affect. His behavior is normal. Judgment and thought content normal.          Assessment & Plan:

## 2010-08-19 NOTE — Assessment & Plan Note (Signed)
Due for TSH

## 2010-08-19 NOTE — Assessment & Plan Note (Signed)
Good med compliance, no change  

## 2010-08-23 ENCOUNTER — Telehealth: Payer: Self-pay | Admitting: *Deleted

## 2010-08-23 NOTE — Telephone Encounter (Signed)
Message left for patient to return my call.  

## 2010-08-24 NOTE — Telephone Encounter (Signed)
Pt aware of labs  

## 2011-02-17 ENCOUNTER — Encounter: Payer: Self-pay | Admitting: Internal Medicine

## 2011-02-20 ENCOUNTER — Encounter: Payer: Self-pay | Admitting: Internal Medicine

## 2011-02-20 ENCOUNTER — Ambulatory Visit (INDEPENDENT_AMBULATORY_CARE_PROVIDER_SITE_OTHER): Payer: Medicare Other | Admitting: Internal Medicine

## 2011-02-20 DIAGNOSIS — I10 Essential (primary) hypertension: Secondary | ICD-10-CM

## 2011-02-20 DIAGNOSIS — E785 Hyperlipidemia, unspecified: Secondary | ICD-10-CM

## 2011-02-20 DIAGNOSIS — N400 Enlarged prostate without lower urinary tract symptoms: Secondary | ICD-10-CM

## 2011-02-20 DIAGNOSIS — E213 Hyperparathyroidism, unspecified: Secondary | ICD-10-CM

## 2011-02-20 DIAGNOSIS — E039 Hypothyroidism, unspecified: Secondary | ICD-10-CM

## 2011-02-20 DIAGNOSIS — Z0000 Encounter for general adult medical examination without abnormal findings: Secondary | ICD-10-CM | POA: Insufficient documentation

## 2011-02-20 MED ORDER — ALFUZOSIN HCL ER 10 MG PO TB24: 10.0000 mg | ORAL_TABLET | Freq: Every day | ORAL | Status: DC

## 2011-02-20 MED ORDER — RANITIDINE HCL 150 MG PO CAPS: 150.0000 mg | ORAL_CAPSULE | Freq: Every day | ORAL | Status: DC | PRN

## 2011-02-20 MED ORDER — ATENOLOL 50 MG PO TABS: 50.0000 mg | ORAL_TABLET | Freq: Every day | ORAL | Status: DC

## 2011-02-20 MED ORDER — LEVOTHYROXINE SODIUM 125 MCG PO TABS: 125.0000 ug | ORAL_TABLET | Freq: Every day | ORAL | Status: DC

## 2011-02-20 MED ORDER — SIMVASTATIN 40 MG PO TABS: 40.0000 mg | ORAL_TABLET | Freq: Every day | ORAL | Status: DC

## 2011-02-20 NOTE — Assessment & Plan Note (Signed)
Sx stable, labs

## 2011-02-20 NOTE — Assessment & Plan Note (Signed)
Due for labs

## 2011-02-20 NOTE — Assessment & Plan Note (Addendum)
TD 07 Pneumonia shot 08 shingles shot? 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

## 2011-02-20 NOTE — Assessment & Plan Note (Signed)
-  Recheck PTH 

## 2011-02-20 NOTE — Assessment & Plan Note (Signed)
Labs

## 2011-02-20 NOTE — Progress Notes (Signed)
Subjective:    Patient ID: Damon Foley, male    DOB: 09-15-1940, 70 y.o.   MRN: [id number]  HPI Here for Medicare AWV: 1. Risk factors based on Past M, S, F history: reviewed  2. Physical Activities: volunteers  x3 /week at the hospital, active , no routine exercise  3. Depression/mood:  no problems, no issues noted  4. Hearing: has hearing aids, helping a lot  5. ADL's: totally independent 6. Fall Risk: no recent falls, low risk  7. Home Safety: feels safe at home  8. Height, weight, &visual acuity: see VS,  vision wel corrected w/  glasses  9. Counseling: yes  10. Labs ordered based on risk factors: yes  11.       Referral Coordination, if needed  12.           Care Plan, see a/p  13.            Cognitive Assessment , motor skills, memory and cognition seems within normal  in addition, we discussed the following issues  Hypothyroid-- good med compliance GERD-- well controlled depending on diet  HTN -- no ambulatory BPs , good medication compliance  Hyperlipidemia-- good medication compliance, no s/e  that he can tell Benign prostatic hypertrophy-- nocturia x 2 , for years, symptoms stable    Past Medical History  Diagnosis Date  . Hypoparathyroidism 2004  . ETOH abuse 1991    quit  . Carotid bruit 2002    per chart review, right, neg u/s  . Diverticulitis   . GERD (gastroesophageal reflux disease)   . Hyperlipidemia   . Hypertension   . Hypothyroidism     hypo  . Renal calculus   . Benign prostatic hypertrophy    Past Surgical History  Procedure Date  . Cholecystectomy   . Kidney stone surgery 2006    right kidney stone removed, had stent, did not work,  . Hydrocele excision / repair 1987    LEFT   History   Social History  . Marital Status: Divorced    Spouse Name: N/A    Number of Children: 1  . Years of Education: N/A   Occupational History  . retired     Social History Main Topics  . Smoking status: Former Smoker    Quit date: 04/10/1989    . Smokeless tobacco: Never Used  . Alcohol Use: No     quit - 04/1989  . Drug Use: No  . Sexually Active: Not on file   Other Topics Concern  . Not on file   Social History Narrative   Divorced lives by self---Has one daughter in [place] to [place] in May 2008 from [place]----   Family History  Problem Relation Age of Onset  . Heart attack Mother 87  . Prostate cancer Father 11  . Cancer Brother     type?  . Diabetes Neg Hx   . Colon cancer Neg Hx     Review of Systems No chest pain or shortness or breath No nausea, vomiting, diarrhea or blood in the stools. No abdominal pain No dysuria or difficulty urinating. No gross hematuria.    Objective:   Physical Exam  Constitutional: He is oriented to person, place, and time. He appears well-developed and well-nourished.  HENT:  Head: Normocephalic and atraumatic.  Neck: No thyromegaly present.       Normal carotid pulses, no bruit  Cardiovascular: Normal rate, regular rhythm and normal heart sounds.   No murmur heard. Pulmonary/Chest:  Effort normal and breath sounds normal. No respiratory distress. He has no wheezes. He has no rales.  Abdominal: Soft. Bowel sounds are normal. He exhibits no distension. There is no tenderness. There is no rebound and no guarding.  Genitourinary:       Rectal normal, prostate is slightly enlarged, not tender or nodular  Musculoskeletal: He exhibits no edema.  Neurological: He is alert and oriented to person, place, and time.  Psychiatric: He has a normal mood and affect. His behavior is normal. Judgment and thought content normal.      Assessment & Plan:

## 2011-02-20 NOTE — Assessment & Plan Note (Signed)
Labs , well controlled , recheck 6 months

## 2011-02-21 LAB — BASIC METABOLIC PANEL
BUN: 14 mg/dL (ref 6–23)
Chloride: 105 mEq/L (ref 96–112)
GFR: 73.8 mL/min (ref >60.00)
Glucose, Bld: 100 mg/dL — ABNORMAL HIGH (ref 70–99)
Potassium: 4.6 mEq/L (ref 3.5–5.1)
Sodium: 140 mEq/L (ref 135–145)

## 2011-02-21 LAB — LIPID PANEL
Cholesterol: 191 mg/dL (ref 0–200)
Triglycerides: 362 mg/dL — ABNORMAL HIGH (ref 0.0–149.0)

## 2011-02-21 LAB — PTH, INTACT AND CALCIUM: PTH: 68.5 pg/mL (ref 14.0–72.0)

## 2011-02-21 LAB — TSH: TSH: 1.24 u[IU]/mL (ref 0.35–5.50)

## 2011-08-21 ENCOUNTER — Ambulatory Visit (INDEPENDENT_AMBULATORY_CARE_PROVIDER_SITE_OTHER): Payer: Medicare Other | Admitting: Internal Medicine

## 2011-08-21 ENCOUNTER — Encounter: Payer: Self-pay | Admitting: Internal Medicine

## 2011-08-21 VITALS — BP 148/92 | HR 51 | Temp 98.0°F | Wt 238.0 lb

## 2011-08-21 DIAGNOSIS — E785 Hyperlipidemia, unspecified: Secondary | ICD-10-CM

## 2011-08-21 DIAGNOSIS — I10 Essential (primary) hypertension: Secondary | ICD-10-CM

## 2011-08-21 DIAGNOSIS — E039 Hypothyroidism, unspecified: Secondary | ICD-10-CM

## 2011-08-21 DIAGNOSIS — N400 Enlarged prostate without lower urinary tract symptoms: Secondary | ICD-10-CM | POA: Diagnosis not present

## 2011-08-21 DIAGNOSIS — H6090 Unspecified otitis externa, unspecified ear: Secondary | ICD-10-CM

## 2011-08-21 DIAGNOSIS — H60399 Other infective otitis externa, unspecified ear: Secondary | ICD-10-CM

## 2011-08-21 MED ORDER — TAMSULOSIN HCL 0.4 MG PO CAPS: 0.4000 mg | ORAL_CAPSULE | Freq: Every day | ORAL | Status: DC

## 2011-08-21 MED ORDER — CIPROFLOXACIN-DEXAMETHASONE 0.3-0.1 % OT SUSP: 4.0000 [drp] | Freq: Two times a day (BID) | OTIC | Status: AC

## 2011-08-21 NOTE — Assessment & Plan Note (Signed)
BP slightly elevated today, no ambulatory BPs. See instructions, will recommend patient to monitor his blood pressures

## 2011-08-21 NOTE — Assessment & Plan Note (Signed)
Last triglycerides are slightly elevated, recommend diet, exercise and labs. See instructions

## 2011-08-21 NOTE — Assessment & Plan Note (Signed)
Check a TSH 

## 2011-08-21 NOTE — Progress Notes (Signed)
  Subjective:    Patient ID: Damon Foley, male    DOB: 04-07-1941, 71 y.o.   MRN: [id number]  HPI Routine office visit 10 days history of pain of the right year, he saw some swelling and green discharge. Overall symptoms are slightly better without any intervension. He has a history of previous otitis that self resolved. He does have some itching. Has not beenusing his right side hearing aid.   Past Medical History  Diagnosis Date  . Hypoparathyroidism 2004  . ETOH abuse 1991    quit  . Carotid bruit 2002    per chart review, right, neg u/s  . Diverticulitis   . GERD (gastroesophageal reflux disease)   . Hyperlipidemia   . Hypertension   . Hypothyroidism     hypo  . Renal calculus   . Benign prostatic hypertrophy       Review of Systems Good medication compliance with all medications. He has a long history of nocturia, initially he felt comfortable and better with Uroxatral but now he would like to change meds to see if he can get a better response, he is currently having at least 2 episodes of nocturia.     Objective:   Physical Exam  Constitutional: He appears well-developed and well-nourished. No distress.  HENT:  Head:         Left ear normal Right year: Concha with some redness and tenderness, canal is slightly swollen and I see a small amount of green discharge. Tympanic membrane is somehow obscure by discharge.   Cardiovascular: Normal rate, regular rhythm and normal heart sounds.   No murmur heard. Pulmonary/Chest: Effort normal and breath sounds normal. No respiratory distress. He has no wheezes. He has no rales.  Skin: He is not diaphoretic.       Assessment & Plan:

## 2011-08-21 NOTE — Patient Instructions (Signed)
Check the  blood pressure 2 or 3 times a week, be sure it is between 110/60 and 140/85. If it is consistently higher or lower, let me know ----- Ear drops for one week, call if not improving ----- Please schedule fasting labs: FLP ---dx hyperlipidemia TSH --- dx hypothyroidism

## 2011-08-21 NOTE — Assessment & Plan Note (Signed)
Long history of nocturia, today he reports that he has at least 2 episodes at night. We discussed changing Uroxatral to Flomax.

## 2011-09-01 ENCOUNTER — Other Ambulatory Visit (INDEPENDENT_AMBULATORY_CARE_PROVIDER_SITE_OTHER): Payer: Medicare Other

## 2011-09-01 DIAGNOSIS — E039 Hypothyroidism, unspecified: Secondary | ICD-10-CM | POA: Diagnosis not present

## 2011-09-01 DIAGNOSIS — E785 Hyperlipidemia, unspecified: Secondary | ICD-10-CM | POA: Diagnosis not present

## 2011-09-01 LAB — LIPID PANEL
Cholesterol: 173 mg/dL (ref 0–200)
HDL: 43.1 mg/dL (ref >39.00)
Triglycerides: 202 mg/dL — ABNORMAL HIGH (ref 0.0–149.0)
VLDL: 40.4 mg/dL — ABNORMAL HIGH (ref 0.0–40.0)

## 2011-09-01 LAB — LDL CHOLESTEROL, DIRECT: Direct LDL: 90.1 mg/dL

## 2011-09-01 NOTE — Progress Notes (Signed)
Labs only

## 2011-09-05 ENCOUNTER — Encounter: Payer: Self-pay | Admitting: *Deleted

## 2011-11-10 ENCOUNTER — Other Ambulatory Visit: Payer: Self-pay | Admitting: *Deleted

## 2011-11-10 MED ORDER — ALFUZOSIN HCL ER 10 MG PO TB24: 10.0000 mg | ORAL_TABLET | Freq: Every day | ORAL | Status: DC

## 2011-11-10 NOTE — Telephone Encounter (Signed)
Pt requests a refill for uroxatral ER 10mg . Take 1 tab daily. #90 with 1 refill.   Pt prefers this med over tamulosin hcl 0.5mg .

## 2012-02-23 ENCOUNTER — Encounter: Payer: Self-pay | Admitting: Internal Medicine

## 2012-02-23 ENCOUNTER — Ambulatory Visit (INDEPENDENT_AMBULATORY_CARE_PROVIDER_SITE_OTHER): Payer: Medicare Other | Admitting: Internal Medicine

## 2012-02-23 VITALS — BP 126/74 | HR 49 | Temp 97.6°F | Wt 205.0 lb

## 2012-02-23 DIAGNOSIS — I10 Essential (primary) hypertension: Secondary | ICD-10-CM

## 2012-02-23 DIAGNOSIS — Z8042 Family history of malignant neoplasm of prostate: Secondary | ICD-10-CM

## 2012-02-23 DIAGNOSIS — H6090 Unspecified otitis externa, unspecified ear: Secondary | ICD-10-CM

## 2012-02-23 DIAGNOSIS — H60399 Other infective otitis externa, unspecified ear: Secondary | ICD-10-CM | POA: Diagnosis not present

## 2012-02-23 DIAGNOSIS — E785 Hyperlipidemia, unspecified: Secondary | ICD-10-CM

## 2012-02-23 DIAGNOSIS — Z23 Encounter for immunization: Secondary | ICD-10-CM | POA: Diagnosis not present

## 2012-02-23 DIAGNOSIS — E039 Hypothyroidism, unspecified: Secondary | ICD-10-CM | POA: Diagnosis not present

## 2012-02-23 DIAGNOSIS — K219 Gastro-esophageal reflux disease without esophagitis: Secondary | ICD-10-CM | POA: Diagnosis not present

## 2012-02-23 DIAGNOSIS — Z0000 Encounter for general adult medical examination without abnormal findings: Secondary | ICD-10-CM | POA: Diagnosis not present

## 2012-02-23 DIAGNOSIS — N400 Enlarged prostate without lower urinary tract symptoms: Secondary | ICD-10-CM | POA: Diagnosis not present

## 2012-02-23 LAB — CBC WITH DIFFERENTIAL/PLATELET
Basophils Absolute: 0 10*3/uL (ref 0.0–0.1)
Basophils Relative: 0.5 % (ref 0.0–3.0)
Eosinophils Absolute: 0.1 10*3/uL (ref 0.0–0.7)
Lymphocytes Relative: 25.3 % (ref 12.0–46.0)
MCHC: 35 g/dL (ref 30.0–36.0)
MCV: 93.7 fl (ref 78.0–100.0)
Monocytes Absolute: 0.5 10*3/uL (ref 0.1–1.0)
Neutrophils Relative %: 61.9 % (ref 43.0–77.0)
RBC: 4.99 Mil/uL (ref 4.22–5.81)
RDW: 13.5 % (ref 11.5–14.6)

## 2012-02-23 LAB — COMPREHENSIVE METABOLIC PANEL
ALT: 34 U/L (ref 0–53)
AST: 24 U/L (ref 0–37)
Albumin: 4.6 g/dL (ref 3.5–5.2)
Alkaline Phosphatase: 49 U/L (ref 39–117)
Calcium: 9.6 mg/dL (ref 8.4–10.5)
Chloride: 104 mEq/L (ref 96–112)
Potassium: 4.3 mEq/L (ref 3.5–5.1)
Sodium: 138 mEq/L (ref 135–145)
Total Protein: 7.4 g/dL (ref 6.0–8.3)

## 2012-02-23 LAB — PSA: PSA: 1.69 ng/mL (ref 0.10–4.00)

## 2012-02-23 LAB — LIPID PANEL
HDL: 47 mg/dL (ref >39.00)
Triglycerides: 194 mg/dL — ABNORMAL HIGH (ref 0.0–149.0)

## 2012-02-23 LAB — TSH: TSH: 2.04 u[IU]/mL (ref 0.35–5.50)

## 2012-02-23 MED ORDER — LEVOTHYROXINE SODIUM 125 MCG PO TABS: 125.0000 ug | ORAL_TABLET | Freq: Every day | ORAL | Status: DC

## 2012-02-23 MED ORDER — ATENOLOL 50 MG PO TABS: 50.0000 mg | ORAL_TABLET | Freq: Every day | ORAL | Status: DC

## 2012-02-23 MED ORDER — HYDROCORTISONE-ACETIC ACID 1-2 % OT SOLN: 4.0000 [drp] | Freq: Two times a day (BID) | OTIC | Status: DC

## 2012-02-23 MED ORDER — TAMSULOSIN HCL 0.4 MG PO CAPS: 0.4000 mg | ORAL_CAPSULE | Freq: Every day | ORAL | Status: DC

## 2012-02-23 MED ORDER — ZOSTER VACCINE LIVE 19400 UNT/0.65ML ~~LOC~~ SOLR: 0.6500 mL | Freq: Once | SUBCUTANEOUS | Status: DC

## 2012-02-23 MED ORDER — SIMVASTATIN 40 MG PO TABS: 40.0000 mg | ORAL_TABLET | Freq: Every day | ORAL | Status: DC

## 2012-02-23 NOTE — Progress Notes (Signed)
Subjective:    Patient ID: Damon Foley, male    DOB: 03/22/1941, 71 y.o.   MRN: [id number]  HPI  Here for Medicare AWV: 1.         Risk factors based on Past M, S, F history: reviewed   2.         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         Depression/mood:  no problems, no issues noted   4.         Hearing: has hearing aids, helping a lot   5.         ADL's: totally independent 6.         Fall Risk: no recent falls, see instructions 7.         Home Safety: feels safe at home   8.         Height, weight, &visual acuity: see VS,  vision wel corrected w/  glasses   9.         Counseling: yes   10.       Labs ordered based on risk factors: yes   11.       Referral Coordination, if needed   12.           Care Plan, see a/p   13.            Cognitive Assessment , motor skills, memory and cognition seems within normal  in addition, we discussed the following issues   Has hearing aids, most nights when he removed the aids, he feels some discharge also a dried crust in the morning; occasional ear itching. High cholesterol, good medication compliance Hypertension good medication compliance, he tried to take ambulatory BPs but they varied so much that he quit. BPH, symptoms relatively well controlled on Flomax Hypothyroidism, good medication compliance.  Past Medical History  Diagnosis Date  . Hypoparathyroidism 2004  . ETOH abuse 1991    quit  . Carotid bruit 2002    per chart review, right, neg u/s  . Diverticulitis   . GERD (gastroesophageal reflux disease)   . Hyperlipidemia   . Hypertension   . Hypothyroidism   . Renal calculus   . Benign prostatic hypertrophy   .psh History   Social History  . Marital Status: Divorced    Spouse Name: N/A    Number of Children: 1  . Years of Education: N/A   Occupational History  . retired     Social History Main Topics  . Smoking status: Former Smoker    Quit date:  04/10/1989  . Smokeless tobacco: Never Used  . Alcohol Use: No     Comment: quit - 04/1989  . Drug Use: No  . Sexually Active: Not on file   Other Topics Concern  . Not on file   Social History Narrative   Divorced lives by self---Has one daughter in [place] to [place] in May 2008 from [place]----   Family History  Problem Relation Age of Onset  . Heart attack Mother 40  . Prostate cancer Father 37  . Cancer Brother     type?  . Diabetes Neg Hx   . Colon cancer Neg Hx     Review of Systems Has changed his diet, eating less amount of food, has lost 33 pounds. Since he did the change, he is feeling great, does not need  Ranitidine anymore No chest pain or  shortness or breath No nausea, vomiting, diarrhea. No blood in the stools.  No dysuria or gross hematuria     Objective:   Physical Exam General -- alert, well-developed, and well-nourished.   Neck --no thyromegaly HEENT:  Right year: Canal with a small amount of creamy discharge, TM seems normal, concha w/ some redness Left ear: Very mild changes in the concha otherwise normal. Lungs -- normal respiratory effort, no intercostal retractions, no accessory muscle use, and normal breath sounds.   Heart-- normal rate, regular rhythm, no murmur, and no gallop.   Abdomen--soft, non-tender, no distention, no masses, no HSM, no guarding, and no rigidity.   Extremities-- no pretibial edema bilaterally Rectal-- No external abnormalities noted. Normal sphincter tone. No rectal masses or tenderness. No Stools found Prostate:  Prostate gland firm and smooth, no enlargement, nodularity, tenderness, mass, asymmetry or induration. Neurologic-- alert & oriented X3 and strength normal in all extremities. Psych-- Cognition and judgment appear intact. Alert and cooperative with normal attention span and concentration.  not anxious appearing and not depressed appearing.      Assessment & Plan:

## 2012-02-23 NOTE — Assessment & Plan Note (Signed)
Good medication compliance, check a TSH 

## 2012-02-23 NOTE — Assessment & Plan Note (Signed)
Due for labs

## 2012-02-23 NOTE — Assessment & Plan Note (Signed)
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

## 2012-02-23 NOTE — Assessment & Plan Note (Signed)
Since he lost weight, needs no medication

## 2012-02-23 NOTE — Patient Instructions (Addendum)
Use ear drops for at least 2 weeks, if not better you need to see a ENT doctor, let me know if you need a referral    Fall Prevention and Home Safety Falls cause injuries and can affect all age groups. It is possible to use preventive measures to significantly decrease the likelihood of falls. There are many simple measures which can make your home safer and prevent falls. OUTDOORS  Repair cracks and edges of walkways and driveways.  Remove high doorway thresholds.  Trim shrubbery on the main path into your home.  Have good outside lighting.  Clear walkways of tools, rocks, debris, and clutter.  Check that handrails are not broken and are securely fastened. Both sides of steps should have handrails.  Have leaves, snow, and ice cleared regularly.  Use sand or salt on walkways during winter months.  In the garage, clean up grease or oil spills. BATHROOM  Install night lights.  Install grab bars by the toilet and in the tub and shower.  Use non-skid mats or decals in the tub or shower.  Place a plastic non-slip stool in the shower to sit on, if needed.  Keep floors dry and clean up all water on the floor immediately.  Remove soap buildup in the tub or shower on a regular basis.  Secure bath mats with non-slip, double-sided rug tape.  Remove throw rugs and tripping hazards from the floors. BEDROOMS  Install night lights.  Make sure a bedside light is easy to reach.  Do not use oversized bedding.  Keep a telephone by your bedside.  Have a firm chair with side arms to use for getting dressed.  Remove throw rugs and tripping hazards from the floor. KITCHEN  Keep handles on pots and pans turned toward the center of the stove. Use back burners when possible.  Clean up spills quickly and allow time for drying.  Avoid walking on wet floors.  Avoid hot utensils and knives.  Position shelves so they are not too high or low.  Place commonly used objects within  easy reach.  If necessary, use a sturdy step stool with a grab bar when reaching.  Keep electrical cables out of the way.  Do not use floor polish or wax that makes floors slippery. If you must use wax, use non-skid floor wax.  Remove throw rugs and tripping hazards from the floor. STAIRWAYS  Never leave objects on stairs.  Place handrails on both sides of stairways and use them. Fix any loose handrails. Make sure handrails on both sides of the stairways are as long as the stairs.  Check carpeting to make sure it is firmly attached along stairs. Make repairs to worn or loose carpet promptly.  Avoid placing throw rugs at the top or bottom of stairways, or properly secure the rug with carpet tape to prevent slippage. Get rid of throw rugs, if possible.  Have an electrician put in a light switch at the top and bottom of the stairs. OTHER FALL PREVENTION TIPS  Wear low-heel or rubber-soled shoes that are supportive and fit well. Wear closed toe shoes.  When using a stepladder, make sure it is fully opened and both spreaders are firmly locked. Do not climb a closed stepladder.  Add color or contrast paint or tape to grab bars and handrails in your home. Place contrasting color strips on first and last steps.  Learn and use mobility aids as needed. Install an electrical emergency response system.  Turn on lights  to avoid dark areas. Replace light bulbs that burn out immediately. Get light switches that glow.  Arrange furniture to create clear pathways. Keep furniture in the same place.  Firmly attach carpet with non-skid or double-sided tape.  Eliminate uneven floor surfaces.  Select a carpet pattern that does not visually hide the edge of steps.  Be aware of all pets. OTHER HOME SAFETY TIPS  Set the water temperature for 120 F (48.8 C).  Keep emergency numbers on or near the telephone.  Keep smoke detectors on every level of the home and near sleeping areas. Document  Released: 03/17/2002 Document Revised: 09/26/2011 Document Reviewed: 06/16/2011 [facility] Patient Information 2013 [place], [place].

## 2012-02-23 NOTE — Assessment & Plan Note (Signed)
Findings consistent with chronic external otitis, trial with vosol HC (needs to check w/ audiologist before use) If not better, needs to see ENT

## 2012-02-23 NOTE — Assessment & Plan Note (Signed)
Well-controlled at the office, no change. See history of present illness, not taking ambulatory BPs

## 2012-02-23 NOTE — Assessment & Plan Note (Signed)
Symptoms well controlled at present with Flomax. Family history of prostate cancer Check PSA

## 2012-02-26 ENCOUNTER — Encounter: Payer: Self-pay | Admitting: Internal Medicine

## 2012-02-27 DIAGNOSIS — H2510 Age-related nuclear cataract, unspecified eye: Secondary | ICD-10-CM | POA: Diagnosis not present

## 2012-02-27 DIAGNOSIS — H52229 Regular astigmatism, unspecified eye: Secondary | ICD-10-CM | POA: Diagnosis not present

## 2012-02-27 DIAGNOSIS — H5210 Myopia, unspecified eye: Secondary | ICD-10-CM | POA: Diagnosis not present

## 2012-02-27 DIAGNOSIS — H524 Presbyopia: Secondary | ICD-10-CM | POA: Diagnosis not present

## 2012-02-28 LAB — HEMOGLOBIN A1C: Hgb A1c MFr Bld: 5.4 % (ref 4.6–6.5)

## 2012-05-25 ENCOUNTER — Other Ambulatory Visit: Payer: Self-pay

## 2012-08-23 ENCOUNTER — Ambulatory Visit: Payer: Medicare Other | Admitting: Internal Medicine

## 2012-08-30 ENCOUNTER — Ambulatory Visit (INDEPENDENT_AMBULATORY_CARE_PROVIDER_SITE_OTHER): Payer: Medicare Other | Admitting: Internal Medicine

## 2012-08-30 ENCOUNTER — Encounter: Payer: Self-pay | Admitting: Internal Medicine

## 2012-08-30 VITALS — BP 140/82 | HR 50 | Wt 210.0 lb

## 2012-08-30 DIAGNOSIS — E785 Hyperlipidemia, unspecified: Secondary | ICD-10-CM | POA: Diagnosis not present

## 2012-08-30 DIAGNOSIS — I10 Essential (primary) hypertension: Secondary | ICD-10-CM | POA: Diagnosis not present

## 2012-08-30 DIAGNOSIS — Z0000 Encounter for general adult medical examination without abnormal findings: Secondary | ICD-10-CM

## 2012-08-30 DIAGNOSIS — E039 Hypothyroidism, unspecified: Secondary | ICD-10-CM | POA: Diagnosis not present

## 2012-08-30 DIAGNOSIS — H6090 Unspecified otitis externa, unspecified ear: Secondary | ICD-10-CM

## 2012-08-30 LAB — LIPID PANEL
Cholesterol: 172 mg/dL (ref 0–200)
Triglycerides: 118 mg/dL (ref 0.0–149.0)
VLDL: 23.6 mg/dL (ref 0.0–40.0)

## 2012-08-30 NOTE — Assessment & Plan Note (Signed)
Reports he did get a shingles shot.

## 2012-08-30 NOTE — Patient Instructions (Addendum)
Check the  blood pressure 2 or 3 times a week, be sure it is between 110/60 and 140/85. If it is consistently higher or lower, let me know Next visit 6 months

## 2012-08-30 NOTE — Assessment & Plan Note (Addendum)
No amb blood pressures, BP today is satisfactory, recommend to keep an eye on his blood pressure, see instructions

## 2012-08-30 NOTE — Progress Notes (Signed)
  Subjective:    Patient ID: Damon Foley, male    DOB: 09/29/1940, 72 y.o.   MRN: [id number]  HPI 6 months followup Feeling very well, we review his medication and  labs. Good compliance with meds. Had the shingles shot.  Past Medical History  Diagnosis Date  . Hypoparathyroidism 2004  . ETOH abuse 1991    quit  . Carotid bruit 2002    per chart review, right, neg u/s  . Diverticulitis   . GERD (gastroesophageal reflux disease)   . Hyperlipidemia   . Hypertension   . Hypothyroidism   . Renal calculus   . Benign prostatic hypertrophy    Past Surgical History  Procedure Laterality Date  . Cholecystectomy    . Kidney stone surgery  2006    right kidney stone removed, had stent, did not work,  . Hydrocele excision / repair  1987    LEFT   History   Social History  . Marital Status: Divorced    Spouse Name: N/A    Number of Children: 1  . Years of Education: N/A   Occupational History  . retired , volunteers at [employer]    .     Social History Main Topics  . Smoking status: Former Smoker    Quit date: 04/10/1989  . Smokeless tobacco: Never Used  . Alcohol Use: No     Comment: quit - 04/1989  . Drug Use: No  . Sexually Active: Not on file   Other Topics Concern  . Not on file   Social History Narrative   Divorced lives by self    Used to have  one daughter in [place] , she moved back to [place]   Moved to [place] in May 2008 from [place]     Review of Systems Denies any heartburn. He diet is okay, he is working mostly on portion control,   able to keep his weight stable. Was seen with otitis externa few months ago, it responded very well to ear drops. Not ambulatory BPs.     Objective:   Physical Exam BP 140/82  Pulse 50  Wt 210 lb (95.255 kg)  BMI 29.3 kg/m2  SpO2 97%  General -- alert, well-developed, NAD .   Lungs -- normal respiratory effort, no intercostal retractions, no accessory muscle use, and normal breath sounds.   Heart-- normal rate,  regular rhythm, no murmur, and no gallop.  Extremities-- no pretibial edema bilaterally  Neurologic-- alert & oriented X3 and strength normal in all extremities. Psych-- Cognition and judgment appear intact. Alert and cooperative with normal attention span and concentration.  not anxious appearing and not depressed appearing.       Assessment & Plan:

## 2012-08-30 NOTE — Assessment & Plan Note (Signed)
resolved 

## 2012-08-30 NOTE — Assessment & Plan Note (Addendum)
Last cholesterol panel showed a slight increase in cholesterol, he remains active, his diet is okay, mostly working on portion control. Plan: Labs

## 2012-08-30 NOTE — Assessment & Plan Note (Addendum)
Good compliance with all   medications, TSH has been stable over time. Plan to check a TSH on return to the office

## 2012-11-29 ENCOUNTER — Telehealth: Payer: Self-pay

## 2012-11-29 MED ORDER — SILODOSIN 8 MG PO CAPS: 8.0000 mg | ORAL_CAPSULE | Freq: Every day | ORAL | Status: DC

## 2012-11-29 NOTE — Addendum Note (Signed)
Addended by: Maurice Small on: 11/29/2012 01:39 PM   Modules accepted: Orders

## 2012-11-29 NOTE — Telephone Encounter (Signed)
he can try the generic version or switch to Rapaflo 8 mg 1 po qd #90, 1 RF

## 2012-11-29 NOTE — Telephone Encounter (Addendum)
Patient called back to say he would like rx sent to [facility]. [facility] [address] spoke with [employer]- RX cancelled

## 2012-11-29 NOTE — Telephone Encounter (Signed)
RX sent to pharmacy for Rapaflo. Left message on VM informing patient

## 2012-11-29 NOTE — Telephone Encounter (Signed)
Message left on triage voicemail: Patient was contacted by [facility]- flomax is no longer available on his plan, patient needs an alternative sent to pharmacy.   Please advise (Last OV 08/30/2012)

## 2012-12-02 ENCOUNTER — Telehealth: Payer: Self-pay | Admitting: General Practice

## 2012-12-02 NOTE — Telephone Encounter (Signed)
Message left on triage line. Pt wanted to ensure that Rx had been sent to [facility]. Called pt back and LMOVM notifying him that on 11/29/12 we sent a Rx for Rapaflo #90 with 1 refill. Pt advised to call back if another med needed a refill.

## 2013-02-13 ENCOUNTER — Other Ambulatory Visit: Payer: Self-pay

## 2013-03-05 ENCOUNTER — Telehealth: Payer: Self-pay

## 2013-03-05 NOTE — Telephone Encounter (Signed)
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

## 2013-03-10 ENCOUNTER — Ambulatory Visit (INDEPENDENT_AMBULATORY_CARE_PROVIDER_SITE_OTHER): Payer: Medicare Other | Admitting: Internal Medicine

## 2013-03-10 ENCOUNTER — Encounter: Payer: Self-pay | Admitting: Internal Medicine

## 2013-03-10 VITALS — BP 174/89 | HR 61 | Temp 98.2°F | Ht 71.5 in | Wt 207.0 lb

## 2013-03-10 DIAGNOSIS — I10 Essential (primary) hypertension: Secondary | ICD-10-CM

## 2013-03-10 DIAGNOSIS — E785 Hyperlipidemia, unspecified: Secondary | ICD-10-CM

## 2013-03-10 DIAGNOSIS — Z0000 Encounter for general adult medical examination without abnormal findings: Secondary | ICD-10-CM

## 2013-03-10 DIAGNOSIS — N400 Enlarged prostate without lower urinary tract symptoms: Secondary | ICD-10-CM

## 2013-03-10 DIAGNOSIS — E039 Hypothyroidism, unspecified: Secondary | ICD-10-CM

## 2013-03-10 LAB — COMPREHENSIVE METABOLIC PANEL
ALT: 23 U/L (ref 0–53)
Albumin: 4.4 g/dL (ref 3.5–5.2)
CO2: 26 mEq/L (ref 19–32)
Calcium: 9.5 mg/dL (ref 8.4–10.5)
Chloride: 106 mEq/L (ref 96–112)
GFR: 63.51 mL/min (ref >60.00)
Glucose, Bld: 124 mg/dL — ABNORMAL HIGH (ref 70–99)
Potassium: 4 mEq/L (ref 3.5–5.1)
Sodium: 140 mEq/L (ref 135–145)
Total Protein: 7.3 g/dL (ref 6.0–8.3)

## 2013-03-10 LAB — LIPID PANEL: Cholesterol: 172 mg/dL (ref 0–200)

## 2013-03-10 LAB — TSH: TSH: 0.77 u[IU]/mL (ref 0.35–5.50)

## 2013-03-10 MED ORDER — SILODOSIN 8 MG PO CAPS: 8.0000 mg | ORAL_CAPSULE | Freq: Every day | ORAL | Status: DC

## 2013-03-10 MED ORDER — COZAAR 100 MG PO TABS: 100.0000 mg | ORAL_TABLET | Freq: Every day | ORAL | Status: DC

## 2013-03-10 MED ORDER — LEVOTHYROXINE SODIUM 125 MCG PO TABS: 125.0000 ug | ORAL_TABLET | Freq: Every day | ORAL | Status: DC

## 2013-03-10 MED ORDER — ATENOLOL 50 MG PO TABS: 50.0000 mg | ORAL_TABLET | Freq: Every day | ORAL | Status: DC

## 2013-03-10 MED ORDER — SIMVASTATIN 40 MG PO TABS: 40.0000 mg | ORAL_TABLET | Freq: Every day | ORAL | Status: DC

## 2013-03-10 NOTE — Assessment & Plan Note (Addendum)
D/t insurance constraints we changed from Flomax to rapaflo, symptoms well-controlled, no change.

## 2013-03-10 NOTE — Progress Notes (Signed)
Subjective:    Patient ID: Damon Foley, male    DOB: 21-Sep-1940, 72 y.o.   MRN: [id number]  HPI Here for Medicare AWV:  1. Risk factors based on Past M, S, F history: reviewed  2. Physical Activities: volunteers x3 /week at the hospital, active , no routine exercise  3. Depression/mood:  Neg screen  4. Hearing: has hearing aids  5. ADL's: totally independent  6. Fall Risk: no recent falls, see instructions  7. Home Safety: feels safe at home  8. Height, weight, &visual acuity: see VS, vision well corrected w/ glasses, sees eye doctor 1 per year 9. Counseling: yes  10. Labs ordered based on risk factors: yes  11. Referral Coordination, if needed  12. Care Plan, see a/p  13. Cognitive Assessment , motor skills, memory and cognition seems within normal   in addition, we discussed the following issues  Hypertension--good compliance with medications, not ambulatory BPs. High cholesterol--good compliance with meds, no apparent side effects. BPH--used to be on Flomax, now on rapaflo, essentially no symptoms during the daytime, nocturia sometimes, one or 2 times a night. Hypothyroidism-as good compliance with Synthroid.  Past Medical History  Diagnosis Date  . Hypoparathyroidism 2004  . ETOH abuse 1991    quit  . Carotid bruit 2002    per chart review, right, neg u/s  . Diverticulitis   . GERD (gastroesophageal reflux disease)   . Hyperlipidemia   . Hypertension   . Hypothyroidism   . Renal calculus   . Benign prostatic hypertrophy    Past Surgical History  Procedure Laterality Date  . Cholecystectomy    . Kidney stone surgery  2006    right kidney stone removed, had stent, did not work,  . Hydrocele excision / repair  1987    LEFT   History   Social History  . Marital Status: Divorced    Spouse Name: N/A    Number of Children: 1  . Years of Education: N/A   Occupational History  . retired , volunteers at [employer]    .     Social History Main Topics  .  Smoking status: Former Smoker    Quit date: 04/10/1989  . Smokeless tobacco: Never Used  . Alcohol Use: No     Comment: quit - 04/1989  . Drug Use: No  . Sexual Activity: Not on file   Other Topics Concern  . Not on file   Social History Narrative   Divorced lives by self    one daughter in [place] to [place] in May 2008 from [place]    Family History  Problem Relation Age of Onset  . Heart attack Mother 49  . Prostate cancer Father 41  . Cancer Brother     type?  . Diabetes Neg Hx   . Colon cancer Neg Hx    Review of Systems In general feels well No  CP, SOB, lower extremity edema Denies  nausea, vomiting diarrhea Denies  blood in the stools (-) cough, sputum production, (-) wheezing, chest congestion No dysuria, gross hematuria, difficulty urinating       Objective:   Physical Exam BP 174/89  Pulse 61  Temp(Src) 98.2 F (36.8 C)  Ht 5' 11.5" (1.816 m)  Wt 207 lb (93.895 kg)  BMI 28.47 kg/m2  SpO2 100% General -- alert, well-developed, NAD.  Neck --no thyromegaly Lungs -- normal respiratory effort, no intercostal retractions, no accessory muscle use, and normal breath sounds.  Heart--  normal rate, regular rhythm, no murmur.  Abdomen-- Not distended, good bowel sounds,soft, non-tender. Extremities-- no pretibial edema bilaterally  Neurologic--  alert & oriented X3. Speech normal, gait normal, strength normal in all extremities.  Psych-- Cognition and judgment appear intact. Cooperative with normal attention span and concentration. No anxious appearing , no depressed appearing.      Assessment & Plan:

## 2013-03-10 NOTE — Patient Instructions (Addendum)
Get your blood work before you leave  Start losartan 100 mg one tablet daily, watch for blood pressure symptoms (weak, dizzy, fatigued). Check the  blood pressure at the pharmacy 2 or 3 times a week be sure it is between 110/60 and 140/85. Ideal blood pressure is 120/80. If it is consistently higher or lower, let me know  Come back in 3 weeks blood work again: BMP ---DX hypertension  Next visit  for a  hypertension follow up, no fasting, in 4 months  Please make an appointment      Fall Prevention and Home Safety Falls cause injuries and can affect all age groups. It is possible to use preventive measures to significantly decrease the likelihood of falls. There are many simple measures which can make your home safer and prevent falls. OUTDOORS  Repair cracks and edges of walkways and driveways.  Remove high doorway thresholds.  Trim shrubbery on the main path into your home.  Have good outside lighting.  Clear walkways of tools, rocks, debris, and clutter.  Check that handrails are not broken and are securely fastened. Both sides of steps should have handrails.  Have leaves, snow, and ice cleared regularly.  Use sand or salt on walkways during winter months.  In the garage, clean up grease or oil spills. BATHROOM  Install night lights.  Install grab bars by the toilet and in the tub and shower.  Use non-skid mats or decals in the tub or shower.  Place a plastic non-slip stool in the shower to sit on, if needed.  Keep floors dry and clean up all water on the floor immediately.  Remove soap buildup in the tub or shower on a regular basis.  Secure bath mats with non-slip, double-sided rug tape.  Remove throw rugs and tripping hazards from the floors. BEDROOMS  Install night lights.  Make sure a bedside light is easy to reach.  Do not use oversized bedding.  Keep a telephone by your bedside.  Have a firm chair with side arms to use for getting dressed.  Remove  throw rugs and tripping hazards from the floor. KITCHEN  Keep handles on pots and pans turned toward the center of the stove. Use back burners when possible.  Clean up spills quickly and allow time for drying.  Avoid walking on wet floors.  Avoid hot utensils and knives.  Position shelves so they are not too high or low.  Place commonly used objects within easy reach.  If necessary, use a sturdy step stool with a grab bar when reaching.  Keep electrical cables out of the way.  Do not use floor polish or wax that makes floors slippery. If you must use wax, use non-skid floor wax.  Remove throw rugs and tripping hazards from the floor. STAIRWAYS  Never leave objects on stairs.  Place handrails on both sides of stairways and use them. Fix any loose handrails. Make sure handrails on both sides of the stairways are as long as the stairs.  Check carpeting to make sure it is firmly attached along stairs. Make repairs to worn or loose carpet promptly.  Avoid placing throw rugs at the top or bottom of stairways, or properly secure the rug with carpet tape to prevent slippage. Get rid of throw rugs, if possible.  Have an electrician put in a light switch at the top and bottom of the stairs. OTHER FALL PREVENTION TIPS  Wear low-heel or rubber-soled shoes that are supportive and fit well. Wear closed  toe shoes.  When using a stepladder, make sure it is fully opened and both spreaders are firmly locked. Do not climb a closed stepladder.  Add color or contrast paint or tape to grab bars and handrails in your home. Place contrasting color strips on first and last steps.  Learn and use mobility aids as needed. Install an electrical emergency response system.  Turn on lights to avoid dark areas. Replace light bulbs that burn out immediately. Get light switches that glow.  Arrange furniture to create clear pathways. Keep furniture in the same place.  Firmly attach carpet with non-skid or  double-sided tape.  Eliminate uneven floor surfaces.  Select a carpet pattern that does not visually hide the edge of steps.  Be aware of all pets. OTHER HOME SAFETY TIPS  Set the water temperature for 120 F (48.8 C).  Keep emergency numbers on or near the telephone.  Keep smoke detectors on every level of the home and near sleeping areas. Document Released: 03/17/2002 Document Revised: 09/26/2011 Document Reviewed: 06/16/2011 [facility] Patient Information 2014 [place], [place].

## 2013-03-10 NOTE — Assessment & Plan Note (Signed)
Good medication compliance, take a TSH

## 2013-03-10 NOTE — Assessment & Plan Note (Addendum)
BP recheck manually  in both arms: 190/80, no ambulatory BPs. EKG sinus brady Plan: Continue with Tenormin 50 mg daily. Add losartan 100. BMP in 2 weeks. Recommend to check his BP at the pharmacy.

## 2013-03-10 NOTE — Progress Notes (Signed)
Pre visit review using our clinic review tool, if applicable. No additional management support is needed unless otherwise documented below in the visit note. 

## 2013-03-10 NOTE — Assessment & Plan Note (Addendum)
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

## 2013-03-10 NOTE — Assessment & Plan Note (Signed)
Due for labs

## 2013-03-26 ENCOUNTER — Encounter: Payer: Self-pay | Admitting: Gastroenterology

## 2013-03-31 ENCOUNTER — Other Ambulatory Visit (INDEPENDENT_AMBULATORY_CARE_PROVIDER_SITE_OTHER): Payer: Medicare Other

## 2013-03-31 DIAGNOSIS — I10 Essential (primary) hypertension: Secondary | ICD-10-CM

## 2013-03-31 LAB — BASIC METABOLIC PANEL
CO2: 28 mEq/L (ref 19–32)
Chloride: 102 mEq/L (ref 96–112)
Creatinine, Ser: 1.2 mg/dL (ref 0.4–1.5)
Glucose, Bld: 109 mg/dL — ABNORMAL HIGH (ref 70–99)
Potassium: 3.9 mEq/L (ref 3.5–5.1)
Sodium: 139 mEq/L (ref 135–145)

## 2013-04-08 ENCOUNTER — Encounter: Payer: Self-pay | Admitting: *Deleted

## 2013-04-08 ENCOUNTER — Other Ambulatory Visit: Payer: Self-pay | Admitting: *Deleted

## 2013-04-08 MED ORDER — ATENOLOL 50 MG PO TABS: 50.0000 mg | ORAL_TABLET | Freq: Every day | ORAL | Status: DC

## 2013-04-10 ENCOUNTER — Encounter: Payer: Self-pay | Admitting: Internal Medicine

## 2013-04-11 NOTE — Telephone Encounter (Signed)
Msg left to cal the office       KP

## 2013-04-13 ENCOUNTER — Encounter: Payer: Self-pay | Admitting: Internal Medicine

## 2013-04-14 ENCOUNTER — Telehealth: Payer: Self-pay | Admitting: *Deleted

## 2013-04-14 DIAGNOSIS — I10 Essential (primary) hypertension: Secondary | ICD-10-CM

## 2013-04-14 MED ORDER — LISINOPRIL 40 MG PO TABS: 40.0000 mg | ORAL_TABLET | Freq: Every day | ORAL | Status: DC

## 2013-04-14 NOTE — Telephone Encounter (Signed)
Pt agrees to try lisinopril 40mg  rx sent. Pt scheduled for lab visit 05/19/13 (BMP)

## 2013-04-14 NOTE — Telephone Encounter (Signed)
1. Tenormin is genetic,can not get less expensive than that, I suggested to get it from the local pharmacy.  2. Losartan is also generic, I cannot understand why it has been denied. My suggestions are: --Go to a local pharmacy --Change to another medication called lisinopril 40 mg one by mouth daily #30 and 3 refills. If he decides to change will need a BMP in one month

## 2013-04-14 NOTE — Addendum Note (Signed)
Addended by: Peggyann Shoals on: 04/14/2013 01:45 PM   Modules accepted: Orders

## 2013-04-14 NOTE — Telephone Encounter (Signed)
Pt states his medication for losartan was [facility], states that they cant refill two hypertension medications losartan 100mg  and tenormin 50mg . Please advise .

## 2013-05-16 ENCOUNTER — Other Ambulatory Visit (INDEPENDENT_AMBULATORY_CARE_PROVIDER_SITE_OTHER): Payer: Medicare Other

## 2013-05-16 DIAGNOSIS — I10 Essential (primary) hypertension: Secondary | ICD-10-CM | POA: Diagnosis not present

## 2013-05-19 ENCOUNTER — Other Ambulatory Visit: Payer: Medicare Other | Admitting: Internal Medicine

## 2013-05-19 LAB — BASIC METABOLIC PANEL
BUN: 17 mg/dL (ref 6–23)
CO2: 28 mEq/L (ref 19–32)
Calcium: 9.5 mg/dL (ref 8.4–10.5)
Chloride: 100 mEq/L (ref 96–112)
Creatinine, Ser: 1.3 mg/dL (ref 0.4–1.5)
GFR: 55.35 mL/min — ABNORMAL LOW (ref >60.00)
Glucose, Bld: 116 mg/dL — ABNORMAL HIGH (ref 70–99)
Potassium: 4 mEq/L (ref 3.5–5.1)
Sodium: 136 mEq/L (ref 135–145)

## 2013-07-09 ENCOUNTER — Ambulatory Visit (INDEPENDENT_AMBULATORY_CARE_PROVIDER_SITE_OTHER): Payer: Medicare Other | Admitting: Internal Medicine

## 2013-07-09 ENCOUNTER — Encounter: Payer: Self-pay | Admitting: Internal Medicine

## 2013-07-09 ENCOUNTER — Telehealth: Payer: Self-pay | Admitting: Internal Medicine

## 2013-07-09 VITALS — BP 137/76 | HR 60 | Temp 98.3°F | Wt 208.0 lb

## 2013-07-09 DIAGNOSIS — I10 Essential (primary) hypertension: Secondary | ICD-10-CM

## 2013-07-09 LAB — BASIC METABOLIC PANEL
BUN: 18 mg/dL (ref 6–23)
CO2: 26 mEq/L (ref 19–32)
Calcium: 9.7 mg/dL (ref 8.4–10.5)
Chloride: 104 mEq/L (ref 96–112)
Creatinine, Ser: 1.2 mg/dL (ref 0.4–1.5)
GFR: 61.08 mL/min (ref >60.00)
Glucose, Bld: 113 mg/dL — ABNORMAL HIGH (ref 70–99)
POTASSIUM: 4.2 meq/L (ref 3.5–5.1)
SODIUM: 136 meq/L (ref 135–145)

## 2013-07-09 MED ORDER — LISINOPRIL 40 MG PO TABS: 40.0000 mg | ORAL_TABLET | Freq: Every day | ORAL | Status: DC

## 2013-07-09 NOTE — Patient Instructions (Signed)
Get your blood work before you leave   Walk-in the office in 2 months and ask my nurse to check your BP and let me know  (no charge)  Next visit is for routine check up regards your blood  pressure  in 6 months  No need to come back fasting Please make an appointment

## 2013-07-09 NOTE — Assessment & Plan Note (Addendum)
Since the last time he was here, his insurance did not cover losartan, currently on lisinopril. Not doing ambulatory BPs, see HPI, BP today is very good. No apparent side effects. Plan: BMP Refill lisinopril Followup in 6 months Check BP here in 2 months. See instructions

## 2013-07-09 NOTE — Progress Notes (Signed)
Subjective:    Patient ID: Damon Foley, male    DOB: 03/02/1941, 73 y.o.   MRN: [id number]  DOS:  07/09/2013 Type of  visit:  Followup from previous visit  Here for a hypertension followup. Good compliance and tolerance with lisinopril, he thinks the  ambulatory BPs are unreliable so she has not been taking BPs lately.      ROS Denies cough or difficulty breathing No chest pain, dyspnea on exertion, headaches. No nausea or vomiting.   Past Medical History  Diagnosis Date  . Hypoparathyroidism 2004  . ETOH abuse 1991    quit  . Carotid bruit 2002    per chart review, right, neg u/s  . Diverticulitis   . GERD (gastroesophageal reflux disease)   . Hyperlipidemia   . Hypertension   . Hypothyroidism   . Renal calculus   . Benign prostatic hypertrophy     Past Surgical History  Procedure Laterality Date  . Cholecystectomy    . Kidney stone surgery  2006    right kidney stone removed, had stent, did not work,  . Hydrocele excision / repair  1987    LEFT    History   Social History  . Marital Status: Divorced    Spouse Name: N/A    Number of Children: 1  . Years of Education: N/A   Occupational History  . retired , volunteers at [employer]    .     Social History Main Topics  . Smoking status: Former Smoker    Quit date: 04/10/1989  . Smokeless tobacco: Never Used  . Alcohol Use: No     Comment: quit - 04/1989  . Drug Use: No  . Sexual Activity: Not on file   Other Topics Concern  . Not on file   Social History Narrative   Divorced lives by self    one daughter in [place] to [place] in May 2008 from [place]         Medication List       This list is accurate as of: 07/09/13 12:54 PM.  Always use your most recent med list.               atenolol 50 MG tablet  Commonly known as:  TENORMIN  Take 1 tablet (50 mg total) by mouth daily.     levothyroxine 125 MCG tablet  Commonly known as:  SYNTHROID, LEVOTHROID  Take 1 tablet (125 mcg  total) by mouth daily.     lisinopril 40 MG tablet  Commonly known as:  PRINIVIL,ZESTRIL  Take 1 tablet (40 mg total) by mouth daily.     silodosin 8 MG Caps capsule  Commonly known as:  RAPAFLO  Take 1 capsule (8 mg total) by mouth daily with breakfast.     simvastatin 40 MG tablet  Commonly known as:  ZOCOR  Take 1 tablet (40 mg total) by mouth at bedtime.           Objective:   Physical Exam BP 137/76  Pulse 60  Temp(Src) 98.3 F (36.8 C)  Wt 208 lb (94.348 kg)  SpO2 100%  General -- alert, well-developed, NAD.   Lungs -- normal respiratory effort, no intercostal retractions, no accessory muscle use, and normal breath sounds.  Heart-- normal rate, regular rhythm, no murmur.  Extremities-- no pretibial edema bilaterally  Neurologic--  alert & oriented X3. Speech normal, gait normal, strength normal in all extremities. Psych-- Cognition and judgment appear intact. Cooperative  with normal attention span and concentration. No anxious or depressed appearing.        Assessment & Plan:

## 2013-07-09 NOTE — Progress Notes (Signed)
Pre visit review using our clinic review tool, if applicable. No additional management support is needed unless otherwise documented below in the visit note. 

## 2013-07-09 NOTE — Telephone Encounter (Signed)
Relevant patient education assigned to patient using Emmi. ° °

## 2013-09-12 ENCOUNTER — Ambulatory Visit (INDEPENDENT_AMBULATORY_CARE_PROVIDER_SITE_OTHER): Payer: Medicare Other | Admitting: Internal Medicine

## 2013-09-12 VITALS — BP 144/77 | HR 60 | Temp 98.0°F | Wt 210.0 lb

## 2013-09-12 DIAGNOSIS — R21 Rash and other nonspecific skin eruption: Secondary | ICD-10-CM | POA: Diagnosis not present

## 2013-09-12 DIAGNOSIS — I10 Essential (primary) hypertension: Secondary | ICD-10-CM | POA: Diagnosis not present

## 2013-09-12 MED ORDER — NYSTATIN-TRIAMCINOLONE 100000-0.1 UNIT/GM-% EX OINT: 1.0000 "application " | TOPICAL_OINTMENT | Freq: Two times a day (BID) | CUTANEOUS | Status: DC

## 2013-09-12 NOTE — Patient Instructions (Signed)
Apply cream twice a day x 10 days Call if no better or if rash resurface   Next visit by [phone]

## 2013-09-12 NOTE — Progress Notes (Signed)
Pre visit review using our clinic review tool, if applicable. No additional management support is needed unless otherwise documented below in the visit note. 

## 2013-09-12 NOTE — Assessment & Plan Note (Signed)
BP today is  satisfactory, no change

## 2013-09-12 NOTE — Progress Notes (Signed)
Subjective:    Patient ID: Damon Foley, male    DOB: 11-27-1940, 73 y.o.   MRN: [id number]  DOS:  09/12/2013 Type of  Visit: acute History: One-month history of a rash on the left side of the face, rash is some days flat and other slightly raised ; he is concerned about rash being skin cancer. Prior to the rash, the skin looked normal. Also has a history of hypertension, good medication compliance, no apparent side effects. No ambulatory BPs  ROS Reports no blisters or bleeding at the site of the rash. No pain or discharge.   Past Medical History  Diagnosis Date  . Hypoparathyroidism 2004  . ETOH abuse 1991    quit  . Carotid bruit 2002    per chart review, right, neg u/s  . Diverticulitis   . GERD (gastroesophageal reflux disease)   . Hyperlipidemia   . Hypertension   . Hypothyroidism   . Renal calculus   . Benign prostatic hypertrophy     Past Surgical History  Procedure Laterality Date  . Cholecystectomy    . Kidney stone surgery  2006    right kidney stone removed, had stent, did not work,  . Hydrocele excision / repair  1987    LEFT    History   Social History  . Marital Status: Divorced    Spouse Name: N/A    Number of Children: 1  . Years of Education: N/A   Occupational History  . retired , volunteers at [employer]    .     Social History Main Topics  . Smoking status: Former Smoker    Quit date: 04/10/1989  . Smokeless tobacco: Never Used  . Alcohol Use: No     Comment: quit - 04/1989  . Drug Use: No  . Sexual Activity: Not on file   Other Topics Concern  . Not on file   Social History Narrative   Divorced lives by self    one daughter in [place] to [place] in May 2008 from [place]         Medication List       This list is accurate as of: 09/12/13 11:59 PM.  Always use your most recent med list.               atenolol 50 MG tablet  Commonly known as:  TENORMIN  Take 1 tablet (50 mg total) by mouth daily.     levothyroxine 125 MCG tablet  Commonly known as:  SYNTHROID, LEVOTHROID  Take 1 tablet (125 mcg total) by mouth daily.     lisinopril 40 MG tablet  Commonly known as:  PRINIVIL,ZESTRIL  Take 1 tablet (40 mg total) by mouth daily.     nystatin-triamcinolone ointment  Commonly known as:  MYCOLOG  Apply 1 application topically 2 (two) times daily.     silodosin 8 MG Caps capsule  Commonly known as:  RAPAFLO  Take 1 capsule (8 mg total) by mouth daily with breakfast.     simvastatin 40 MG tablet  Commonly known as:  ZOCOR  Take 1 tablet (40 mg total) by mouth at bedtime.           Objective:   Physical Exam  Constitutional: He is oriented to person, place, and time. He appears well-developed and well-nourished. No distress.  HENT:  Head:    Neurological: He is alert and oriented to person, place, and time.  Skin: He is not diaphoretic.  Psychiatric:  He has a normal mood and affect. His behavior is normal. Judgment and thought content normal.   BP 144/77  Pulse 60  Temp(Src) 98 F (36.7 C)  Wt 210 lb (95.255 kg)  SpO2 98%       Assessment & Plan:   Rash, One-month history of a rash, trial with Mycolog, if not better he will call because if the rash is persistent will need a derm  eval

## 2013-10-23 ENCOUNTER — Encounter: Payer: Self-pay | Admitting: Gastroenterology

## 2013-11-11 ENCOUNTER — Ambulatory Visit (INDEPENDENT_AMBULATORY_CARE_PROVIDER_SITE_OTHER): Payer: Medicare Other | Admitting: Medical

## 2013-11-11 ENCOUNTER — Encounter: Payer: Self-pay | Admitting: Medical

## 2013-11-11 VITALS — BP 114/67 | HR 57 | Temp 98.5°F | Wt 210.2 lb

## 2013-11-11 DIAGNOSIS — H6120 Impacted cerumen, unspecified ear: Secondary | ICD-10-CM | POA: Diagnosis not present

## 2013-11-11 DIAGNOSIS — H6122 Impacted cerumen, left ear: Secondary | ICD-10-CM

## 2013-11-11 MED ORDER — HYDROCORTISONE-ACETIC ACID 1-2 % OT SOLN: 3.0000 [drp] | Freq: Three times a day (TID) | OTIC | Status: DC

## 2013-11-11 NOTE — Progress Notes (Signed)
Subjective:    Patient ID: Damon Foley, male    DOB: February 12, 1941, 73 y.o.   MRN: [id number]  HPI  Pt in with lt ear feels muffled and blocked. No chills, no fever, no uri or allergy signs or symptoms. Symptoms last 4-5 days. He did irrigate his ears at home and got some wax out but his left ear stills feels some clogged. He does have hx of cerumen impaction.  Past Medical History  Diagnosis Date  . Hypoparathyroidism 2004  . ETOH abuse 1991    quit  . Carotid bruit 2002    per chart review, right, neg u/s  . Diverticulitis   . GERD (gastroesophageal reflux disease)   . Hyperlipidemia   . Hypertension   . Hypothyroidism   . Renal calculus   . Benign prostatic hypertrophy     History   Social History  . Marital Status: Divorced    Spouse Name: N/A    Number of Children: 1  . Years of Education: N/A   Occupational History  . retired , volunteers at [employer]    .     Social History Main Topics  . Smoking status: Former Smoker    Quit date: 04/10/1989  . Smokeless tobacco: Never Used  . Alcohol Use: No     Comment: quit - 04/1989  . Drug Use: No  . Sexual Activity: Not on file   Other Topics Concern  . Not on file   Social History Narrative   Divorced lives by self    one daughter in [place] to [place] in May 2008 from [place]     Past Surgical History  Procedure Laterality Date  . Cholecystectomy    . Kidney stone surgery  2006    right kidney stone removed, had stent, did not work,  . Hydrocele excision / repair  1987    LEFT    Family History  Problem Relation Age of Onset  . Heart attack Mother 17  . Prostate cancer Father 13  . Cancer Brother     type?  . Diabetes Neg Hx   . Colon cancer Neg Hx     No Known Allergies  Current Outpatient Prescriptions on File Prior to Visit  Medication Sig Dispense Refill  . atenolol (TENORMIN) 50 MG tablet Take 1 tablet (50 mg total) by mouth daily.  90 tablet  0  . levothyroxine (SYNTHROID,  LEVOTHROID) 125 MCG tablet Take 1 tablet (125 mcg total) by mouth daily.  90 tablet  3  . lisinopril (PRINIVIL,ZESTRIL) 40 MG tablet Take 1 tablet (40 mg total) by mouth daily.  90 tablet  3  . silodosin (RAPAFLO) 8 MG CAPS capsule Take 1 capsule (8 mg total) by mouth daily with breakfast.  90 capsule  3  . simvastatin (ZOCOR) 40 MG tablet Take 1 tablet (40 mg total) by mouth at bedtime.  90 tablet  3   No current facility-administered medications on file prior to visit.    BP 114/67  Pulse 57  Temp(Src) 98.5 F (36.9 C) (Oral)  Wt 210 lb 3.2 oz (95.346 kg)  SpO2 97%    Review of Systems  See hpi     Objective:   Physical Exam  Constitutional: He is oriented to person, place, and time. He appears well-developed and well-nourished. No distress.  HENT:  Head: Normocephalic and atraumatic.  Right Ear: External ear normal.  Left Ear: External ear normal.  Nose: Nose normal.  Mouth/Throat: Oropharynx  is clear and moist.  Moderate wax obstruction. I removed some and ma lavaged rest. Post lavage mild red canal and faint red appearance to tm.  Neck: Normal range of motion. Neck supple.  Cardiovascular: Normal rate and regular rhythm.  Exam reveals friction rub. Exam reveals no gallop.   No murmur heard. Pulmonary/Chest: Effort normal and breath sounds normal. No respiratory distress. He has no wheezes. He has no rales. He exhibits no tenderness.  Neurological: He is alert and oriented to person, place, and time.  Skin: He is not diaphoretic.          Assessment & Plan:

## 2013-11-11 NOTE — Progress Notes (Signed)
Pre visit review using our clinic review tool, if applicable. No additional management support is needed unless otherwise documented below in the visit note. 

## 2013-11-11 NOTE — Assessment & Plan Note (Signed)
Lavage in office. If later ear pain then fill ear drop rx. If future cerumen impaction then advised use debrox.

## 2013-11-11 NOTE — Patient Instructions (Addendum)
We lavaged you lt ear today in the office. If impaction reoccurs in future then use debrox otc. Avoid use of q tips. I am making available prescription of acetic acid with hydrocortisone so you can start today.(Since post lavage mild inflamed canal) If you have any problems or questions please let us know. Follow up in 7 days any persiting symptoms or as needed.

## 2014-01-09 ENCOUNTER — Telehealth: Payer: Self-pay | Admitting: Internal Medicine

## 2014-01-09 ENCOUNTER — Encounter: Payer: Self-pay | Admitting: Internal Medicine

## 2014-01-09 ENCOUNTER — Ambulatory Visit (INDEPENDENT_AMBULATORY_CARE_PROVIDER_SITE_OTHER): Payer: Medicare Other | Admitting: Internal Medicine

## 2014-01-09 VITALS — BP 152/80 | HR 60 | Temp 98.2°F | Wt 213.0 lb

## 2014-01-09 DIAGNOSIS — N400 Enlarged prostate without lower urinary tract symptoms: Secondary | ICD-10-CM | POA: Diagnosis not present

## 2014-01-09 DIAGNOSIS — Z23 Encounter for immunization: Secondary | ICD-10-CM | POA: Diagnosis not present

## 2014-01-09 DIAGNOSIS — Z0000 Encounter for general adult medical examination without abnormal findings: Secondary | ICD-10-CM

## 2014-01-09 DIAGNOSIS — E785 Hyperlipidemia, unspecified: Secondary | ICD-10-CM | POA: Diagnosis not present

## 2014-01-09 DIAGNOSIS — E034 Atrophy of thyroid (acquired): Secondary | ICD-10-CM

## 2014-01-09 DIAGNOSIS — I10 Essential (primary) hypertension: Secondary | ICD-10-CM | POA: Diagnosis not present

## 2014-01-09 DIAGNOSIS — E038 Other specified hypothyroidism: Secondary | ICD-10-CM

## 2014-01-09 MED ORDER — LISINOPRIL 40 MG PO TABS: 40.0000 mg | ORAL_TABLET | Freq: Every day | ORAL | Status: DC

## 2014-01-09 MED ORDER — ATENOLOL 50 MG PO TABS: 50.0000 mg | ORAL_TABLET | Freq: Every day | ORAL | Status: DC

## 2014-01-09 MED ORDER — LEVOTHYROXINE SODIUM 125 MCG PO TABS: 125.0000 ug | ORAL_TABLET | Freq: Every day | ORAL | Status: DC

## 2014-01-09 MED ORDER — SIMVASTATIN 40 MG PO TABS: 40.0000 mg | ORAL_TABLET | Freq: Every day | ORAL | Status: DC

## 2014-01-09 MED ORDER — HYDROCORTISONE-ACETIC ACID 1-2 % OT SOLN: 3.0000 [drp] | Freq: Three times a day (TID) | OTIC | Status: DC

## 2014-01-09 MED ORDER — SILODOSIN 8 MG PO CAPS: 8.0000 mg | ORAL_CAPSULE | Freq: Every day | ORAL | Status: DC

## 2014-01-09 NOTE — Progress Notes (Signed)
Pre visit review using our clinic review tool, if applicable. No additional management support is needed unless otherwise documented below in the visit note. 

## 2014-01-09 NOTE — Telephone Encounter (Signed)
Caller name: Julez Relation to pt: self Call back number: [phone] [facility]  Reason for call:   Patient states that all medications that were sent today to [facility] should have gone to [facility]

## 2014-01-09 NOTE — Assessment & Plan Note (Signed)
prevnar provided  Already had a flu shot

## 2014-01-09 NOTE — Patient Instructions (Signed)
Please come back to the office 3-4 months  for a physical exam. Come back fasting

## 2014-01-09 NOTE — Telephone Encounter (Signed)
All medications resent to [facility].

## 2014-01-09 NOTE — Progress Notes (Signed)
Subjective:    Patient ID: Damon Foley, male    DOB: [birth year], 73 y.o.   MRN: [id number]  DOS:  01/09/2014 Type of visit - description : rov Interval history: In general states he is doing great. Medication list reviewed, good compliance without apparent side effects. Needs a number of refills. Labs reviewed, not due for any labs today. He remains physically active, diet is regular. No recent ambulatory BPs.   ROS Denies chest pain -lower extremity edema. No difficulty breathing No nausea, vomiting, diarrhea No dysuria, gross hematuria or difficulty urinating. No headaches.   Past Medical History  Diagnosis Date  . Hypoparathyroidism 2004  . ETOH abuse 1991    quit  . Carotid bruit 2002    per chart review, right, neg u/s  . Diverticulitis   . GERD (gastroesophageal reflux disease)   . Hyperlipidemia   . Hypertension   . Hypothyroidism   . Renal calculus   . Benign prostatic hypertrophy     Past Surgical History  Procedure Laterality Date  . Cholecystectomy    . Kidney stone surgery  2006    right kidney stone removed, had stent, did not work,  . Hydrocele excision / repair  1987    LEFT    History   Social History  . Marital Status: Divorced    Spouse Name: N/A    Number of Children: 1  . Years of Education: N/A   Occupational History  . retired , volunteers at [employer]    .     Social History Main Topics  . Smoking status: Former Smoker    Quit date: 04/10/1989  . Smokeless tobacco: Never Used  . Alcohol Use: No     Comment: quit - 04/1989  . Drug Use: No  . Sexual Activity: Not on file   Other Topics Concern  . Not on file   Social History Narrative   Divorced lives by self    one daughter in [place] to [place] in May 2008 from [place]         Medication List       This list is accurate as of: 01/09/14 11:59 PM.  Always use your most recent med list.               acetic acid-hydrocortisone otic solution  Commonly  known as:  VOSOL-HC  Place 3 drops into the left ear 3 (three) times daily.     atenolol 50 MG tablet  Commonly known as:  TENORMIN  Take 1 tablet (50 mg total) by mouth daily.     levothyroxine 125 MCG tablet  Commonly known as:  SYNTHROID, LEVOTHROID  Take 1 tablet (125 mcg total) by mouth daily.     lisinopril 40 MG tablet  Commonly known as:  PRINIVIL,ZESTRIL  Take 1 tablet (40 mg total) by mouth daily.     silodosin 8 MG Caps capsule  Commonly known as:  RAPAFLO  Take 1 capsule (8 mg total) by mouth daily with breakfast.     simvastatin 40 MG tablet  Commonly known as:  ZOCOR  Take 1 tablet (40 mg total) by mouth at bedtime.           Objective:   Physical Exam BP 152/80  Pulse 60  Temp(Src) 98.2 F (36.8 C) (Oral)  Wt 213 lb (96.616 kg)  SpO2 100% General -- alert, well-developed, NAD.   Lungs -- normal respiratory effort, no intercostal retractions, no accessory muscle  use, and normal breath sounds.  Heart-- normal rate, regular rhythm, no murmur.   Extremities-- no pretibial edema bilaterally  Neurologic--  alert & oriented X3. Speech normal, gait appropriate for age, strength symmetric and appropriate for age.  Psych-- Cognition and judgment appear intact. Cooperative with normal attention span and concentration. No anxious or depressed appearing.       Assessment & Plan:   BPH, symptoms well-controlled, refill medications Hypothyroidism, good compliance with meds, refill meds. High cholesterol, on simvastatin, needs a refill  Followup 3-4  months for a physical

## 2014-01-09 NOTE — Assessment & Plan Note (Signed)
Good medication compliance,  patient reluctant to do ambulatory BPs because he does not trust readings. The last time he was here BP was ok, today is slightly elevated Plan: No change, refill medications , f/u 3-4  months

## 2014-04-10 DIAGNOSIS — J358 Other chronic diseases of tonsils and adenoids: Secondary | ICD-10-CM

## 2014-04-10 DIAGNOSIS — C4491 Basal cell carcinoma of skin, unspecified: Secondary | ICD-10-CM

## 2014-04-10 HISTORY — DX: Other chronic diseases of tonsils and adenoids: J35.8

## 2014-04-10 HISTORY — DX: Basal cell carcinoma of skin, unspecified: C44.91

## 2014-04-17 ENCOUNTER — Ambulatory Visit (INDEPENDENT_AMBULATORY_CARE_PROVIDER_SITE_OTHER): Payer: Medicare Other | Admitting: Internal Medicine

## 2014-04-17 ENCOUNTER — Encounter: Payer: Self-pay | Admitting: Internal Medicine

## 2014-04-17 VITALS — BP 128/74 | HR 52 | Temp 97.9°F | Ht 72.0 in | Wt 212.2 lb

## 2014-04-17 DIAGNOSIS — Z0000 Encounter for general adult medical examination without abnormal findings: Secondary | ICD-10-CM

## 2014-04-17 DIAGNOSIS — E785 Hyperlipidemia, unspecified: Secondary | ICD-10-CM

## 2014-04-17 DIAGNOSIS — I10 Essential (primary) hypertension: Secondary | ICD-10-CM | POA: Diagnosis not present

## 2014-04-17 DIAGNOSIS — E034 Atrophy of thyroid (acquired): Secondary | ICD-10-CM

## 2014-04-17 DIAGNOSIS — Z8042 Family history of malignant neoplasm of prostate: Secondary | ICD-10-CM

## 2014-04-17 DIAGNOSIS — N400 Enlarged prostate without lower urinary tract symptoms: Secondary | ICD-10-CM

## 2014-04-17 DIAGNOSIS — E038 Other specified hypothyroidism: Secondary | ICD-10-CM

## 2014-04-17 DIAGNOSIS — E213 Hyperparathyroidism, unspecified: Secondary | ICD-10-CM | POA: Diagnosis not present

## 2014-04-17 LAB — CBC WITH DIFFERENTIAL/PLATELET
BASOS PCT: 0.5 % (ref 0.0–3.0)
Basophils Absolute: 0 10*3/uL (ref 0.0–0.1)
EOS ABS: 0.1 10*3/uL (ref 0.0–0.7)
EOS PCT: 1.4 % (ref 0.0–5.0)
HCT: 46.3 % (ref 39.0–52.0)
Hemoglobin: 16 g/dL (ref 13.0–17.0)
LYMPHS ABS: 0.9 10*3/uL (ref 0.7–4.0)
Lymphocytes Relative: 21.6 % (ref 12.0–46.0)
MCHC: 34.7 g/dL (ref 30.0–36.0)
MCV: 96.5 fl (ref 78.0–100.0)
Monocytes Absolute: 0.4 10*3/uL (ref 0.1–1.0)
Monocytes Relative: 9.1 % (ref 3.0–12.0)
NEUTROS ABS: 2.8 10*3/uL (ref 1.4–7.7)
NEUTROS PCT: 67.4 % (ref 43.0–77.0)
PLATELETS: 155 10*3/uL (ref 150.0–400.0)
RBC: 4.79 Mil/uL (ref 4.22–5.81)
RDW: 12.8 % (ref 11.5–15.5)
WBC: 4.2 10*3/uL (ref 4.0–10.5)

## 2014-04-17 LAB — URINALYSIS, ROUTINE W REFLEX MICROSCOPIC
Bilirubin Urine: NEGATIVE
HGB URINE DIPSTICK: NEGATIVE
Ketones, ur: NEGATIVE
Leukocytes, UA: NEGATIVE
Nitrite: NEGATIVE
SPECIFIC GRAVITY, URINE: 1.02 (ref 1.000–1.030)
Total Protein, Urine: NEGATIVE
Urine Glucose: NEGATIVE
Urobilinogen, UA: 0.2 (ref 0.0–1.0)
pH: 5.5 (ref 5.0–8.0)

## 2014-04-17 LAB — LIPID PANEL
Cholesterol: 147 mg/dL (ref 0–200)
HDL: 32.6 mg/dL — ABNORMAL LOW (ref >39.00)
LDL Cholesterol: 85 mg/dL (ref 0–99)
NonHDL: 114.4
Total CHOL/HDL Ratio: 5
Triglycerides: 148 mg/dL (ref 0.0–149.0)
VLDL: 29.6 mg/dL (ref 0.0–40.0)

## 2014-04-17 LAB — AST: AST: 27 U/L (ref 0–37)

## 2014-04-17 LAB — BASIC METABOLIC PANEL
BUN: 17 mg/dL (ref 6–23)
CO2: 25 meq/L (ref 19–32)
Calcium: 9.5 mg/dL (ref 8.4–10.5)
Chloride: 105 mEq/L (ref 96–112)
Creatinine, Ser: 1.3 mg/dL (ref 0.4–1.5)
GFR: 55.21 mL/min — ABNORMAL LOW (ref >60.00)
GLUCOSE: 111 mg/dL — AB (ref 70–99)
Potassium: 4.4 mEq/L (ref 3.5–5.1)
Sodium: 138 mEq/L (ref 135–145)

## 2014-04-17 LAB — TSH: TSH: 0.67 u[IU]/mL (ref 0.35–4.50)

## 2014-04-17 LAB — PSA: PSA: 1.39 ng/mL (ref 0.10–4.00)

## 2014-04-17 LAB — ALT: ALT: 42 U/L (ref 0–53)

## 2014-04-17 NOTE — Assessment & Plan Note (Signed)
Good compliance with Synthroid 125 mcg, labs

## 2014-04-17 NOTE — Assessment & Plan Note (Signed)
No ambulatory BPs, on lisinopril and Tenormin, BP today is great. Continue same meds, check labs

## 2014-04-17 NOTE — Progress Notes (Signed)
Subjective:    Patient ID: Damon Foley, male    DOB: [birth year], 74 y.o.   MRN: [id number]  DOS:  04/17/2014 Type of visit - description :  HPI Here for Medicare AWV:   1. Risk factors based on Past M, S, F history: reviewed   2. Physical Activities: volunteers x3 /week at the hospital, active , no routine exercise   3. Depression/mood:  Neg screen   4. Hearing: has hearing aids   5. ADL's: totally independent, drives   6. Fall Risk: no recent falls, see instructions   7. Home Safety: feels safe at home   8. Height, weight, &visual acuity: see VS, vision well corrected w/ glasses, sees eye doctor 1 per year, has an appoint next week  9. Counseling: yes   10. Labs ordered based on risk factors: yes   11. Referral Coordination, if needed   12. Care Plan, see a/p   13. Cognitive Assessment , motor skills, memory and cognition seems within normal  14. Care team updated 15. Written plan provided   in addition, we discussed the following issues   High cholesterol, good medication compliance, no apparent side effects Hypertension, no apparent side effects from medicines, not ambulatory BPs, BP today is great BPH, on Rapaflo, still has occasional nocturia sometimes 3 times a night. Hypothyroidism, on meds.  ROS Denies chest pain or difficulty breathing No nausea, vomiting, blood in the stools. No cough, sputum production or wheezing No dysuria, gross hematuria. From time to time he feels that he can't empty his bladder  but that is not persistent or severe symptoms.   Past Medical History  Diagnosis Date  . Hypoparathyroidism 2004  . ETOH abuse 1991    quit  . Carotid bruit 2002    per chart review, right, neg u/s  . Diverticulitis   . GERD (gastroesophageal reflux disease)   . Hyperlipidemia   . Hypertension   . Hypothyroidism   . Renal calculus   . Benign prostatic hypertrophy     Past Surgical History  Procedure Laterality Date  . Cholecystectomy    . Kidney  stone surgery  2006    right kidney stone removed, had stent, did not work,  . Hydrocele excision / repair  1987    LEFT    History   Social History  . Marital Status: Divorced    Spouse Name: N/A    Number of Children: 1  . Years of Education: N/A   Occupational History  . retired , volunteers at [employer]    .     Social History Main Topics  . Smoking status: Former Smoker    Quit date: 04/10/1989  . Smokeless tobacco: Never Used  . Alcohol Use: No     Comment: quit - 04/1989  . Drug Use: No  . Sexual Activity: Not on file   Other Topics Concern  . Not on file   Social History Narrative   Divorced lives by self    one daughter in [place] to [place] in May 2008 from [place]      Family History  Problem Relation Age of Onset  . Heart attack Mother 54  . Prostate cancer Father 15  . Cancer Brother     type?  . Diabetes Neg Hx   . Colon cancer Neg Hx        Medication List       This list is accurate as of: 04/17/14  6:21 PM.  Always use your most recent med list.               atenolol 50 MG tablet  Commonly known as:  TENORMIN  Take 1 tablet (50 mg total) by mouth daily.     levothyroxine 125 MCG tablet  Commonly known as:  SYNTHROID, LEVOTHROID  Take 1 tablet (125 mcg total) by mouth daily.     lisinopril 40 MG tablet  Commonly known as:  PRINIVIL,ZESTRIL  Take 1 tablet (40 mg total) by mouth daily.     silodosin 8 MG Caps capsule  Commonly known as:  RAPAFLO  Take 1 capsule (8 mg total) by mouth daily with breakfast.     simvastatin 40 MG tablet  Commonly known as:  ZOCOR  Take 1 tablet (40 mg total) by mouth at bedtime.           Objective:   Physical Exam BP 128/74 mmHg  Pulse 52  Temp(Src) 97.9 F (36.6 C) (Oral)  Ht 6' (1.829 m)  Wt 212 lb 4 oz (96.276 kg)  BMI 28.78 kg/m2  SpO2 97% General -- alert, well-developed, NAD.  Neck --no thyromegaly , normal carotid pulse  HEENT-- Not pale.  Lungs -- normal respiratory effort,  no intercostal retractions, no accessory muscle use, and normal breath sounds.  Heart-- normal rate, regular rhythm, no murmur.  Abdomen-- Not distended, good bowel sounds,soft, non-tender. No bruit or mass Rectal-- + ext hemorrhoid. Normal sphincter tone. No rectal masses or tenderness. Stool brown  Prostate--Prostate gland firm and smooth, no enlargement, nodularity, tenderness, mass, asymmetry or induration. Extremities-- no pretibial edema bilaterally  Neurologic--  alert & oriented X3. Speech normal, gait appropriate for age, strength symmetric and appropriate for age.  Psych-- Cognition and judgment appear intact. Cooperative with normal attention span and concentration. No anxious or depressed appearing.        Assessment & Plan:

## 2014-04-17 NOTE — Assessment & Plan Note (Signed)
Continue Zocor, labs

## 2014-04-17 NOTE — Assessment & Plan Note (Signed)
At some point PTH was elevated, not check in 3 years, labs

## 2014-04-17 NOTE — Assessment & Plan Note (Addendum)
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 Pt will think about it.  FH prostate ca,  DRE neg today, check a PSA   Doing great w/ diet, exercise

## 2014-04-17 NOTE — Assessment & Plan Note (Addendum)
Rapaflo, symptoms well controlled except for residual  nocturia. Recommend not late drinking fluids, check a UA. + Family history of prostate cancer, check a PSA

## 2014-04-17 NOTE — Progress Notes (Signed)
Pre visit review using our clinic review tool, if applicable. No additional management support is needed unless otherwise documented below in the visit note. 

## 2014-04-17 NOTE — Patient Instructions (Signed)
Get your blood work before you leave    Please come back to the office in 6 to 8 months  for a routine check up  No  fasting       Fall Prevention and Home Safety Falls cause injuries and can affect all age groups. It is possible to use preventive measures to significantly decrease the likelihood of falls. There are many simple measures which can make your home safer and prevent falls. OUTDOORS  Repair cracks and edges of walkways and driveways.  Remove high doorway thresholds.  Trim shrubbery on the main path into your home.  Have good outside lighting.  Clear walkways of tools, rocks, debris, and clutter.  Check that handrails are not broken and are securely fastened. Both sides of steps should have handrails.  Have leaves, snow, and ice cleared regularly.  Use sand or salt on walkways during winter months.  In the garage, clean up grease or oil spills. BATHROOM  Install night lights.  Install grab bars by the toilet and in the tub and shower.  Use non-skid mats or decals in the tub or shower.  Place a plastic non-slip stool in the shower to sit on, if needed.  Keep floors dry and clean up all water on the floor immediately.  Remove soap buildup in the tub or shower on a regular basis.  Secure bath mats with non-slip, double-sided rug tape.  Remove throw rugs and tripping hazards from the floors. BEDROOMS  Install night lights.  Make sure a bedside light is easy to reach.  Do not use oversized bedding.  Keep a telephone by your bedside.  Have a firm chair with side arms to use for getting dressed.  Remove throw rugs and tripping hazards from the floor. KITCHEN  Keep handles on pots and pans turned toward the center of the stove. Use back burners when possible.  Clean up spills quickly and allow time for drying.  Avoid walking on wet floors.  Avoid hot utensils and knives.  Position shelves so they are not too high or low.  Place commonly used  objects within easy reach.  If necessary, use a sturdy step stool with a grab bar when reaching.  Keep electrical cables out of the way.  Do not use floor polish or wax that makes floors slippery. If you must use wax, use non-skid floor wax.  Remove throw rugs and tripping hazards from the floor. STAIRWAYS  Never leave objects on stairs.  Place handrails on both sides of stairways and use them. Fix any loose handrails. Make sure handrails on both sides of the stairways are as long as the stairs.  Check carpeting to make sure it is firmly attached along stairs. Make repairs to worn or loose carpet promptly.  Avoid placing throw rugs at the top or bottom of stairways, or properly secure the rug with carpet tape to prevent slippage. Get rid of throw rugs, if possible.  Have an electrician put in a light switch at the top and bottom of the stairs. OTHER FALL PREVENTION TIPS  Wear low-heel or rubber-soled shoes that are supportive and fit well. Wear closed toe shoes.  When using a stepladder, make sure it is fully opened and both spreaders are firmly locked. Do not climb a closed stepladder.  Add color or contrast paint or tape to grab bars and handrails in your home. Place contrasting color strips on first and last steps.  Learn and use mobility aids as needed. Install an  electrical emergency response system.  Turn on lights to avoid dark areas. Replace light bulbs that burn out immediately. Get light switches that glow.  Arrange furniture to create clear pathways. Keep furniture in the same place.  Firmly attach carpet with non-skid or double-sided tape.  Eliminate uneven floor surfaces.  Select a carpet pattern that does not visually hide the edge of steps.  Be aware of all pets. OTHER HOME SAFETY TIPS  Set the water temperature for 120 F (48.8 C).  Keep emergency numbers on or near the telephone.  Keep smoke detectors on every level of the home and near sleeping  areas. Document Released: 03/17/2002 Document Revised: 09/26/2011 Document Reviewed: 06/16/2011 [facility] Patient Information 2015 No Name, [place]. This information is not intended to replace advice given to you by your health care provider. Make sure you discuss any questions you have with your health care provider.     Preventive Care for Adults   Ages 11 and over  Blood pressure check.** / Every 1 to 2 years.  Lipid and cholesterol check.**/ Every 5 years beginning at age 46.  Lung cancer screening. / Every year if you are aged 81-80 years and have a 30-pack-year history of smoking and currently smoke or have quit within the past 15 years. Yearly screening is stopped once you have quit smoking for at least 15 years or develop a health problem that would prevent you from having lung cancer treatment.  Fecal occult blood test (FOBT) of stool. / Every year beginning at age 50 and continuing until age 37. You may not have to do this test if you get a colonoscopy every 10 years.  Flexible sigmoidoscopy** or colonoscopy.** / Every 5 years for a flexible sigmoidoscopy or every 10 years for a colonoscopy beginning at age 20 and continuing until age 42.  Hepatitis C blood test.** / For all people born from [birth year] through 1967 and any individual with known risks for hepatitis C.  Abdominal aortic aneurysm (AAA) screening.** / A one-time screening for ages 48 to 15 years who are current or former smokers.  Skin self-exam. / Monthly.  Influenza vaccine. / Every year.  Tetanus, diphtheria, and acellular pertussis (Tdap/Td) vaccine.** / 1 dose of Td every 10 years.  Varicella vaccine.** / Consult your health care provider.  Zoster vaccine.** / 1 dose for adults aged 52 years or older.  Pneumococcal 13-valent conjugate (PCV13) vaccine.** / Consult your health care provider.  Pneumococcal polysaccharide (PPSV23) vaccine.** / 1 dose for all adults aged 38 years and older.  Meningococcal  vaccine.** / Consult your health care provider.  Hepatitis A vaccine.** / Consult your health care provider.  Hepatitis B vaccine.** / Consult your health care provider.  Haemophilus influenzae type b (Hib) vaccine.** / Consult your health care provider. **Family history and personal history of risk and conditions may change your health care provider's recommendations. Document Released: 05/23/2001 Document Revised: 04/01/2013 Document Reviewed: 08/22/2010 [facility] Patient Information 2015 [place], [place]. This information is not intended to replace advice given to you by your health care provider. Make sure you discuss any questions you have with your health care provider.

## 2014-04-20 LAB — PARATHYROID HORMONE, INTACT (NO CA): PTH: 50 pg/mL (ref 14–64)

## 2014-04-22 DIAGNOSIS — H2513 Age-related nuclear cataract, bilateral: Secondary | ICD-10-CM | POA: Diagnosis not present

## 2014-04-22 DIAGNOSIS — H25043 Posterior subcapsular polar age-related cataract, bilateral: Secondary | ICD-10-CM | POA: Diagnosis not present

## 2014-04-22 DIAGNOSIS — H25012 Cortical age-related cataract, left eye: Secondary | ICD-10-CM | POA: Diagnosis not present

## 2014-12-18 ENCOUNTER — Encounter: Payer: Self-pay | Admitting: Internal Medicine

## 2014-12-18 ENCOUNTER — Ambulatory Visit (INDEPENDENT_AMBULATORY_CARE_PROVIDER_SITE_OTHER): Payer: Medicare Other | Admitting: Internal Medicine

## 2014-12-18 VITALS — BP 116/70 | HR 60 | Temp 98.1°F | Ht 72.0 in | Wt 214.5 lb

## 2014-12-18 DIAGNOSIS — H6693 Otitis media, unspecified, bilateral: Secondary | ICD-10-CM

## 2014-12-18 DIAGNOSIS — L989 Disorder of the skin and subcutaneous tissue, unspecified: Secondary | ICD-10-CM

## 2014-12-18 DIAGNOSIS — J392 Other diseases of pharynx: Secondary | ICD-10-CM | POA: Diagnosis not present

## 2014-12-18 DIAGNOSIS — M791 Myalgia, unspecified site: Secondary | ICD-10-CM

## 2014-12-18 DIAGNOSIS — E039 Hypothyroidism, unspecified: Secondary | ICD-10-CM | POA: Diagnosis not present

## 2014-12-18 DIAGNOSIS — I10 Essential (primary) hypertension: Secondary | ICD-10-CM

## 2014-12-18 DIAGNOSIS — Z09 Encounter for follow-up examination after completed treatment for conditions other than malignant neoplasm: Secondary | ICD-10-CM

## 2014-12-18 LAB — TSH: TSH: 0.98 u[IU]/mL (ref 0.35–4.50)

## 2014-12-18 LAB — SEDIMENTATION RATE: SED RATE: 13 mm/h (ref 0–22)

## 2014-12-18 LAB — BASIC METABOLIC PANEL
BUN: 19 mg/dL (ref 6–23)
CO2: 25 mEq/L (ref 19–32)
Calcium: 9.5 mg/dL (ref 8.4–10.5)
Chloride: 105 mEq/L (ref 96–112)
Creatinine, Ser: 1.26 mg/dL (ref 0.40–1.50)
GFR: 59.17 mL/min — ABNORMAL LOW (ref >60.00)
Glucose, Bld: 112 mg/dL — ABNORMAL HIGH (ref 70–99)
POTASSIUM: 4.6 meq/L (ref 3.5–5.1)
SODIUM: 137 meq/L (ref 135–145)

## 2014-12-18 LAB — CBC WITH DIFFERENTIAL/PLATELET
BASOS PCT: 0.5 % (ref 0.0–3.0)
Basophils Absolute: 0 10*3/uL (ref 0.0–0.1)
EOS ABS: 0.1 10*3/uL (ref 0.0–0.7)
Eosinophils Relative: 2.5 % (ref 0.0–5.0)
HCT: 42.7 % (ref 39.0–52.0)
HEMOGLOBIN: 15 g/dL (ref 13.0–17.0)
Lymphocytes Relative: 18.2 % (ref 12.0–46.0)
Lymphs Abs: 1 10*3/uL (ref 0.7–4.0)
MCHC: 35.2 g/dL (ref 30.0–36.0)
MCV: 96.3 fl (ref 78.0–100.0)
MONO ABS: 0.6 10*3/uL (ref 0.1–1.0)
Monocytes Relative: 11.4 % (ref 3.0–12.0)
NEUTROS PCT: 67.4 % (ref 43.0–77.0)
Neutro Abs: 3.6 10*3/uL (ref 1.4–7.7)
Platelets: 175 10*3/uL (ref 150.0–400.0)
RBC: 4.44 Mil/uL (ref 4.22–5.81)
RDW: 12.9 % (ref 11.5–15.5)
WBC: 5.3 10*3/uL (ref 4.0–10.5)

## 2014-12-18 LAB — CK TOTAL AND CKMB (NOT AT ARMC): Total CK: 66 U/L (ref 7–232)

## 2014-12-18 MED ORDER — CIPROFLOXACIN-DEXAMETHASONE 0.3-0.1 % OT SUSP: 4.0000 [drp] | Freq: Two times a day (BID) | OTIC | Status: DC

## 2014-12-18 NOTE — Assessment & Plan Note (Signed)
Skin lesion: Started as a rash last year, recently bled and now has 3 dark spots. Refer to dermatology, likely will need a biopsy Throat lesion: Refer to ENT  bilateral otitis externa: Start ciprofloxacin with a steroids, refer to ENT HTN Check a BMP and CBC. BP well-controlled Hypothyroidism: Continue Synthroid, check a TSH Shoulder pain: Will check a CK and sedimentation rate, history not really suggestive of a shoulder injury as pain is bilateral and symmetric. Related to statins? Hold statins for 3 weeks. Follow-up one month

## 2014-12-18 NOTE — Progress Notes (Signed)
Subjective:    Patient ID: Damon Foley, male    DOB: [birth year], 74 y.o.   MRN: [id number]  DOS:  12/18/2014 Type of visit - description : Routine visit, multiple issues Interval history: Has a lesion at the throat, identified by his dentist.  3 weeks history of rather sudden onset of pain around the general area of the shoulders, no pain at rest, + pain whenever he moves his arms reaching across, front or back. Denies any injury or recent overuse. Denies neck pain, weight loss, fevers. He has some degree of pain around the hips and the proximal lower extremities.  Having bilateral ear discharge, clear, no green- yellow, mildly  itching.  Has a skin lesion at the left face, recently a scab fell off and he bleed temporarily.  Hypertension: Good compliance of medication not ambulatory BPs  Hypothyroidism good compliance with medications.   Review of Systems  Foley history of present illness Past Medical History  Diagnosis Date  . Hypoparathyroidism 2004  . ETOH abuse 1991    quit  . Carotid bruit 2002    per chart review, right, neg u/s  . Diverticulitis   . GERD (gastroesophageal reflux disease)   . Hyperlipidemia   . Hypertension   . Hypothyroidism   . Renal calculus   . Benign prostatic hypertrophy     Past Surgical History  Procedure Laterality Date  . Cholecystectomy    . Kidney stone surgery  2006    right kidney stone removed, had stent, did not work,  . Hydrocele excision / repair  1987    LEFT    Social History   Social History  . Marital Status: Divorced    Spouse Name: N/A  . Number of Children: 1  . Years of Education: N/A   Occupational History  . retired , volunteers at [employer]    .     Social History Main Topics  . Smoking status: Former Smoker    Quit date: 04/10/1989  . Smokeless tobacco: Never Used  . Alcohol Use: No     Comment: quit - 04/1989  . Drug Use: No  . Sexual Activity: Not on file   Other Topics Concern  . Not on  file   Social History Narrative   Divorced lives by self    one daughter in [place] to [place] in May 2008 from [place]         Medication List       This list is accurate as of: 12/18/14  5:21 PM.  Always use your most recent med list.               atenolol 50 MG tablet  Commonly known as:  TENORMIN  Take 1 tablet (50 mg total) by mouth daily.     ciprofloxacin-dexamethasone otic suspension  Commonly known as:  CIPRODEX  Place 4 drops into both ears 2 (two) times daily.     levothyroxine 125 MCG tablet  Commonly known as:  SYNTHROID, LEVOTHROID  Take 1 tablet (125 mcg total) by mouth daily.     lisinopril 40 MG tablet  Commonly known as:  PRINIVIL,ZESTRIL  Take 1 tablet (40 mg total) by mouth daily.     silodosin 8 MG Caps capsule  Commonly known as:  RAPAFLO  Take 1 capsule (8 mg total) by mouth daily with breakfast.     simvastatin 40 MG tablet  Commonly known as:  ZOCOR  Take 1 tablet (40  mg total) by mouth at bedtime.           Objective:   Physical Exam  HENT:  Head:     BP 116/70 mmHg  Pulse 60  Temp(Src) 98.1 F (36.7 C) (Oral)  Ht 6' (1.829 m)  Wt 214 lb 8 oz (97.297 kg)  BMI 29.09 kg/m2  SpO2 97% General:   Well developed, well nourished . NAD.  HEENT:  Normocephalic . Face symmetric, atraumatic. Ears: Both ears have a clear yellow discharge, tympanic membranes poorly visualized but apparently intact, no bulging or red. Both concha w/  mild redness. Throat  He does have a half cm, slightly red, papular mucosal lesion at the soft palate close to the tonsil area on the right Neck: No thyromegaly or LADs Lungs:  CTA B Normal respiratory effort, no intercostal retractions, no accessory muscle use. Heart: RRR,  no murmur.  No pretibial edema bilaterally  MSK: Shoulders range of motion symmetric, date complain of pain with extremes of range of motion Skin: Not pale. Not jaundice Neurologic:  alert & oriented X3.  Speech normal,  gait appropriate for age and unassisted Psych--  Cognition and judgment appear intact.  Cooperative with normal attention span and concentration.  Behavior appropriate. No anxious or depressed appearing.      Assessment & Plan:   Problem list > Hypertension Hypothyroidism Hyperlipidemia Hypoparathyroidism 2004 GI: -GERD -Diverticulitis H/o EtOH abuse, quit 1991 BPH Renal calculus Carotid bruit 2002, negative carotid ultrasound Skin lesion, left face  A/P Skin lesion: Started as a rash last year, recently bled and now has 3 dark spots. Refer to dermatology, likely will need a biopsy Throat lesion: Refer to ENT  bilateral otitis externa: Start ciprofloxacin with a steroids, refer to ENT HTN Check a BMP and CBC. BP well-controlled Hypothyroidism: Continue Synthroid, check a TSH Shoulder pain: Will check a CK and sedimentation rate, history not really suggestive of a shoulder injury as pain is bilateral and symmetric. Related to statins? Hold statins for 3 weeks. Follow-up one month

## 2014-12-18 NOTE — Patient Instructions (Signed)
Get your blood work before you leave   Use the eardrops as prescribed for 10 days  Will refer you to a skin specialist and ENT  Stop taking simvastatin for 3 weeks, then restart.    Next visit  for a  checkup, 30 minutes, in one month  Please schedule an appointment at the front desk No need to come back fasting

## 2014-12-18 NOTE — Progress Notes (Signed)
Pre visit review using our clinic review tool, if applicable. No additional management support is needed unless otherwise documented below in the visit note. 

## 2014-12-22 ENCOUNTER — Encounter: Payer: Self-pay | Admitting: Internal Medicine

## 2015-01-01 DIAGNOSIS — J358 Other chronic diseases of tonsils and adenoids: Secondary | ICD-10-CM | POA: Diagnosis not present

## 2015-01-18 ENCOUNTER — Encounter: Payer: Self-pay | Admitting: Internal Medicine

## 2015-01-18 ENCOUNTER — Ambulatory Visit (HOSPITAL_BASED_OUTPATIENT_CLINIC_OR_DEPARTMENT_OTHER)
Admission: RE | Admit: 2015-01-18 | Discharge: 2015-01-18 | Disposition: A | Discharge status: Home / Self Care | Payer: Medicare Other | Source: Ambulatory Visit | Attending: Internal Medicine | Admitting: Internal Medicine

## 2015-01-18 ENCOUNTER — Ambulatory Visit (INDEPENDENT_AMBULATORY_CARE_PROVIDER_SITE_OTHER): Payer: Medicare Other | Admitting: Internal Medicine

## 2015-01-18 VITALS — BP 126/74 | HR 59 | Temp 97.8°F | Ht 72.0 in | Wt 215.2 lb

## 2015-01-18 DIAGNOSIS — M25511 Pain in right shoulder: Secondary | ICD-10-CM | POA: Diagnosis not present

## 2015-01-18 DIAGNOSIS — M19012 Primary osteoarthritis, left shoulder: Secondary | ICD-10-CM | POA: Diagnosis not present

## 2015-01-18 DIAGNOSIS — M25711 Osteophyte, right shoulder: Secondary | ICD-10-CM | POA: Insufficient documentation

## 2015-01-18 DIAGNOSIS — M25512 Pain in left shoulder: Secondary | ICD-10-CM | POA: Diagnosis not present

## 2015-01-18 DIAGNOSIS — M7592 Shoulder lesion, unspecified, left shoulder: Secondary | ICD-10-CM | POA: Diagnosis not present

## 2015-01-18 DIAGNOSIS — M25519 Pain in unspecified shoulder: Secondary | ICD-10-CM | POA: Diagnosis present

## 2015-01-18 DIAGNOSIS — M19011 Primary osteoarthritis, right shoulder: Secondary | ICD-10-CM

## 2015-01-18 DIAGNOSIS — Z09 Encounter for follow-up examination after completed treatment for conditions other than malignant neoplasm: Secondary | ICD-10-CM

## 2015-01-18 DIAGNOSIS — I10 Essential (primary) hypertension: Secondary | ICD-10-CM

## 2015-01-18 MED ORDER — LEVOTHYROXINE SODIUM 125 MCG PO TABS: 125.0000 ug | ORAL_TABLET | Freq: Every day | ORAL | Status: DC

## 2015-01-18 MED ORDER — MELOXICAM 7.5 MG PO TABS: 7.5000 mg | ORAL_TABLET | Freq: Every day | ORAL | Status: DC | PRN

## 2015-01-18 MED ORDER — SIMVASTATIN 40 MG PO TABS: 40.0000 mg | ORAL_TABLET | Freq: Every day | ORAL | Status: DC

## 2015-01-18 MED ORDER — SILODOSIN 8 MG PO CAPS: 8.0000 mg | ORAL_CAPSULE | Freq: Every day | ORAL | Status: DC

## 2015-01-18 MED ORDER — ATENOLOL 50 MG PO TABS: 50.0000 mg | ORAL_TABLET | Freq: Every day | ORAL | Status: DC

## 2015-01-18 MED ORDER — LISINOPRIL 40 MG PO TABS: 40.0000 mg | ORAL_TABLET | Freq: Every day | ORAL | Status: DC

## 2015-01-18 NOTE — Assessment & Plan Note (Signed)
Shoulder pain: Did not improve by holding statins, recent CK, sedimentation rate and CBC normal. With this information, I think pain is related to DJD. Failure to improve with Tylenol or Motrin. Plan: X-rays, dry would meloxicam, refer to sports medicine Skin lesion: Pending dermatology eval Throat lesion: Benign per  ENT eval last month RTC 4 months, CPX

## 2015-01-18 NOTE — Progress Notes (Signed)
Subjective:    Patient ID: Damon Foley, male    DOB: Apr 15, 1940, 74 y.o.   MRN: [id number]  DOS:  01/18/2015 Type of visit - description : Follow-up from previous visit Interval history: Here to follow-up on bilateral shoulder pain>>>  statins were hold, that did not improve the pain. Has took some aspirin with relatively good relief. Again he denies any headache, weight loss, neck pain. As far as the oral lesion, went to ENT and was diagnosed with tonsillar tag Has a skin lesion on the face, dermatology eval pending    Review of Systems   Past Medical History  Diagnosis Date  . Hypoparathyroidism ([place]) 2004  . ETOH abuse 1991    quit  . Carotid bruit 2002    per chart review, right, neg u/s  . Diverticulitis   . GERD (gastroesophageal reflux disease)   . Hyperlipidemia   . Hypertension   . Hypothyroidism   . Renal calculus   . Benign prostatic hypertrophy   . Tonsillar tag 2016    Dr. Redmond Baseman, ENT    Past Surgical History  Procedure Laterality Date  . Cholecystectomy    . Kidney stone surgery  2006    right kidney stone removed, had stent, did not work,  . Hydrocele excision / repair  1987    LEFT    Social History   Social History  . Marital Status: Divorced    Spouse Name: N/A  . Number of Children: 1  . Years of Education: N/A   Occupational History  . retired , volunteers at [employer]    .     Social History Main Topics  . Smoking status: Former Smoker    Quit date: 04/10/1989  . Smokeless tobacco: Never Used  . Alcohol Use: No     Comment: quit - 04/1989  . Drug Use: No  . Sexual Activity: Not on file   Other Topics Concern  . Not on file   Social History Narrative   Divorced lives by self    one daughter in [place] to [place] in May 2008 from [place]         Medication List       This list is accurate as of: 01/18/15  4:16 PM.  Always use your most recent med list.               atenolol 50 MG tablet  Commonly  known as:  TENORMIN  Take 1 tablet (50 mg total) by mouth daily.     ciprofloxacin-dexamethasone otic suspension  Commonly known as:  CIPRODEX  Place 4 drops into both ears 2 (two) times daily.     levothyroxine 125 MCG tablet  Commonly known as:  SYNTHROID, LEVOTHROID  Take 1 tablet (125 mcg total) by mouth daily.     lisinopril 40 MG tablet  Commonly known as:  PRINIVIL,ZESTRIL  Take 1 tablet (40 mg total) by mouth daily.     meloxicam 7.5 MG tablet  Commonly known as:  MOBIC  Take 1-2 tablets (7.5-15 mg total) by mouth daily as needed for pain.     silodosin 8 MG Caps capsule  Commonly known as:  RAPAFLO  Take 1 capsule (8 mg total) by mouth daily with breakfast.     simvastatin 40 MG tablet  Commonly known as:  ZOCOR  Take 1 tablet (40 mg total) by mouth at bedtime.           Objective:  Physical Exam BP 126/74 mmHg  Pulse 59  Temp(Src) 97.8 F (36.6 C) (Oral)  Ht 6' (1.829 m)  Wt 215 lb 4 oz (97.637 kg)  BMI 29.19 kg/m2  SpO2 97%  General:   Well developed, well nourished . NAD.  HEENT:  Normocephalic . Face symmetric, atraumatic Neck: No TTP, range of motion normal MSK: Shoulders with decreased range of motion. Hands and wrists with no synovitis.  Skin: Not pale. Not jaundice Neurologic:  alert & oriented X3.  Speech normal, gait appropriate for age and unassisted; strength and DTRs symmetric Psych--  Cognition and judgment appear intact.  Cooperative with normal attention span and concentration.  Behavior appropriate. No anxious or depressed appearing.      Assessment & Plan:   Assessment >> HTN Hyperlipidemia Hypothyroidism GU: BPH, renal calculus GI: GERD, diverticulitis H/o hyperparathyroidism 2004 (h/o elevated PTH, normalized, last PTH 04-17-14) H/o  EtOH quit 1991 H/o  Tonsillar tag saw ENT 2016 H/o carotid bruit 2002, neg  ultrasound  Plan Shoulder pain: Did not improve by holding statins, recent CK, sedimentation rate and CBC  normal. With this information, I think pain is related to DJD. Failure to improve with Tylenol or Motrin. Plan: X-rays, dry would meloxicam, refer to sports medicine Skin lesion: Pending dermatology eval Throat lesion: Benign per  ENT eval last month RTC 4 months, CPX

## 2015-01-18 NOTE — Progress Notes (Signed)
Pre visit review using our clinic review tool, if applicable. No additional management support is needed unless otherwise documented below in the visit note. 

## 2015-01-18 NOTE — Patient Instructions (Addendum)
Stop by the first floor and get the XR   Meloxicam 1 or 2 tablets as needed for pain.  Always take it with food because may cause gastritis and ulcers.  If you notice nausea, stomach pain, change in the color of stools --->  Stop the medicine and let us know    Next visit  for a physical exam in 4 months, fasting Please schedule an appointment at the front desk

## 2015-01-22 ENCOUNTER — Ambulatory Visit (INDEPENDENT_AMBULATORY_CARE_PROVIDER_SITE_OTHER): Payer: Medicare Other | Admitting: Family Medicine

## 2015-01-22 ENCOUNTER — Encounter: Payer: Self-pay | Admitting: Family Medicine

## 2015-01-22 VITALS — BP 156/74 | HR 64 | Ht 71.0 in | Wt 212.0 lb

## 2015-01-22 DIAGNOSIS — M25511 Pain in right shoulder: Secondary | ICD-10-CM | POA: Diagnosis present

## 2015-01-22 DIAGNOSIS — M25512 Pain in left shoulder: Secondary | ICD-10-CM | POA: Diagnosis not present

## 2015-01-22 NOTE — Progress Notes (Signed)
PCP and referred by: [name], MD  Subjective:   HPI: Patient is a 74 y.o. male here for bilateral shoulder pain.  Patient reports he began having pain in both shoulders about 2 months ago. Pain felt anterolaterally in both. Worse with reaching behind his back, overhead. Worse lying on each side also. Start meloxicam and this has helped a lot. Pain is 3/10, dull but becomes sharp with the motions noted above. No prior shoulder problems. No known injury or trauma. + night pain before starting meloxicam. No numbness/tingling. No skin changes, fever.  Past Medical History  Diagnosis Date  . Hypoparathyroidism ([place]) 2004  . ETOH abuse 1991    quit  . Carotid bruit 2002    per chart review, right, neg u/s  . Diverticulitis   . GERD (gastroesophageal reflux disease)   . Hyperlipidemia   . Hypertension   . Hypothyroidism   . Renal calculus   . Benign prostatic hypertrophy   . Tonsillar tag 2016    Dr. Redmond Baseman, ENT    Current Outpatient Prescriptions on File Prior to Visit  Medication Sig Dispense Refill  . atenolol (TENORMIN) 50 MG tablet Take 1 tablet (50 mg total) by mouth daily. 90 tablet 3  . ciprofloxacin-dexamethasone (CIPRODEX) otic suspension Place 4 drops into both ears 2 (two) times daily. (Patient not taking: Reported on 01/18/2015) 7.5 mL 0  . levothyroxine (SYNTHROID, LEVOTHROID) 125 MCG tablet Take 1 tablet (125 mcg total) by mouth daily. 90 tablet 3  . lisinopril (PRINIVIL,ZESTRIL) 40 MG tablet Take 1 tablet (40 mg total) by mouth daily. 90 tablet 3  . meloxicam (MOBIC) 7.5 MG tablet Take 1-2 tablets (7.5-15 mg total) by mouth daily as needed for pain. 60 tablet 0  . silodosin (RAPAFLO) 8 MG CAPS capsule Take 1 capsule (8 mg total) by mouth daily with breakfast. 90 capsule 3  . simvastatin (ZOCOR) 40 MG tablet Take 1 tablet (40 mg total) by mouth at bedtime. 90 tablet 3   No current facility-administered medications on file prior to visit.    Past Surgical  History  Procedure Laterality Date  . Cholecystectomy    . Kidney stone surgery  2006    right kidney stone removed, had stent, did not work,  . Hydrocele excision / repair  1987    LEFT    No Known Allergies  Social History   Social History  . Marital Status: Divorced    Spouse Name: N/A  . Number of Children: 1  . Years of Education: N/A   Occupational History  . retired , volunteers at [employer]    .     Social History Main Topics  . Smoking status: Former Smoker    Quit date: 04/10/1989  . Smokeless tobacco: Never Used  . Alcohol Use: No     Comment: quit - 04/1989  . Drug Use: No  . Sexual Activity: Not on file   Other Topics Concern  . Not on file   Social History Narrative   Divorced lives by self    one daughter in [place] to [place] in May 2008 from [place]     Family History  Problem Relation Age of Onset  . Heart attack Mother 48  . Prostate cancer Father 55  . Cancer Brother     type?  . Diabetes Neg Hx   . Colon cancer Neg Hx     BP 156/74 mmHg  Pulse 64  Ht 5\' 11"  (1.803 m)  Wt 212 lb (96.163 kg)  BMI 29.58 kg/m2  Review of Systems: See HPI above.    Objective:  Physical Exam:  Gen: NAD  Left shoulder: No swelling, ecchymoses.  No gross deformity. No TTP. FROM - minimal pain at full abduction. Negative Hawkins, Neers. Negative Speeds, Yergasons. Strength 5/5 with empty can and resisted internal/external rotation. Negative apprehension. NV intact distally.  Right shoulder: No swelling, ecchymoses.  No gross deformity. No TTP. FROM minimal pain full abduction. Negative Hawkins, mild positive Neers. Negative Speeds, Yergasons. Strength 5/5 with empty can and resisted internal/external rotation. Negative apprehension. NV intact distally.    Assessment & Plan:  1. Bilateral shoulder pain - 2/2 mild glenohumeral DJD and impingement.  Improving well on meloxicam.  Shown home exercises to do daily.  Ice/heat if needed.   Tylenol as needed.  Discussed glucosamine, topical medications as well.  F/u prn.  Consider physical therapy, injections if not improving.

## 2015-01-22 NOTE — Assessment & Plan Note (Signed)
2/2 mild glenohumeral DJD and impingement.  Improving well on meloxicam.  Shown home exercises to do daily.  Ice/heat if needed.  Tylenol as needed.  Discussed glucosamine, topical medications as well.  F/u prn.  Consider physical therapy, injections if not improving.

## 2015-01-22 NOTE — Patient Instructions (Signed)
Your pain is due to mild arthritis and what sounds like mild rotator cuff impingement (that has improved quite well on the meloxicam). These are the four classes of medicine you can take for this: Tylenol 500mg  1-2 tabs three times a day as needed. Meloxicam 1-2 tabs daily with food for pain and inflammation. Glucosamine sulfate 750mg  twice a day is a supplement that may help. Capsaicin, aspercreme, or biofreeze topically up to four times a day may also help with pain. Cortisone injections are an option. It's important that you continue to stay active. Do home exercises with theraband daily 3 sets of 10 once a day for next 6 weeks. Consider physical therapy to strengthen muscles around the joint that hurts to take pressure off of the joint itself. Heat or ice 15 minutes at a time 3-4 times a day as needed to help with pain. Follow up with me as needed.

## 2015-02-14 ENCOUNTER — Encounter: Payer: Self-pay | Admitting: Internal Medicine

## 2015-02-15 ENCOUNTER — Other Ambulatory Visit: Payer: Self-pay | Admitting: Internal Medicine

## 2015-02-15 DIAGNOSIS — D225 Melanocytic nevi of trunk: Secondary | ICD-10-CM | POA: Diagnosis not present

## 2015-02-15 DIAGNOSIS — L821 Other seborrheic keratosis: Secondary | ICD-10-CM | POA: Diagnosis not present

## 2015-02-15 DIAGNOSIS — D485 Neoplasm of uncertain behavior of skin: Secondary | ICD-10-CM | POA: Diagnosis not present

## 2015-02-15 MED ORDER — MELOXICAM 7.5 MG PO TABS: 7.5000 mg | ORAL_TABLET | Freq: Every day | ORAL | Status: DC | PRN

## 2015-02-16 DIAGNOSIS — C44319 Basal cell carcinoma of skin of other parts of face: Secondary | ICD-10-CM | POA: Diagnosis not present

## 2015-05-06 DIAGNOSIS — C44319 Basal cell carcinoma of skin of other parts of face: Secondary | ICD-10-CM | POA: Diagnosis not present

## 2015-05-10 DIAGNOSIS — H2513 Age-related nuclear cataract, bilateral: Secondary | ICD-10-CM | POA: Diagnosis not present

## 2015-05-10 DIAGNOSIS — H43813 Vitreous degeneration, bilateral: Secondary | ICD-10-CM | POA: Diagnosis not present

## 2015-06-24 ENCOUNTER — Telehealth: Payer: Self-pay | Admitting: Behavioral Health

## 2015-06-24 NOTE — Telephone Encounter (Signed)
Unable to reach patient at time of Pre-Visit Call.  Left message for patient to return call when available.    

## 2015-06-25 ENCOUNTER — Ambulatory Visit (HOSPITAL_BASED_OUTPATIENT_CLINIC_OR_DEPARTMENT_OTHER)
Admission: RE | Admit: 2015-06-25 | Discharge: 2015-06-25 | Disposition: A | Discharge status: Home / Self Care | Payer: Medicare Other | Source: Ambulatory Visit | Attending: Internal Medicine | Admitting: Internal Medicine

## 2015-06-25 ENCOUNTER — Ambulatory Visit (INDEPENDENT_AMBULATORY_CARE_PROVIDER_SITE_OTHER): Payer: Medicare Other | Admitting: Internal Medicine

## 2015-06-25 ENCOUNTER — Encounter: Payer: Self-pay | Admitting: Internal Medicine

## 2015-06-25 VITALS — BP 128/76 | HR 55 | Temp 97.8°F | Ht 71.0 in | Wt 224.1 lb

## 2015-06-25 DIAGNOSIS — M150 Primary generalized (osteo)arthritis: Secondary | ICD-10-CM | POA: Diagnosis not present

## 2015-06-25 DIAGNOSIS — Z0000 Encounter for general adult medical examination without abnormal findings: Secondary | ICD-10-CM

## 2015-06-25 DIAGNOSIS — M159 Polyosteoarthritis, unspecified: Secondary | ICD-10-CM

## 2015-06-25 DIAGNOSIS — R739 Hyperglycemia, unspecified: Secondary | ICD-10-CM | POA: Diagnosis not present

## 2015-06-25 DIAGNOSIS — I10 Essential (primary) hypertension: Secondary | ICD-10-CM

## 2015-06-25 DIAGNOSIS — Z09 Encounter for follow-up examination after completed treatment for conditions other than malignant neoplasm: Secondary | ICD-10-CM

## 2015-06-25 DIAGNOSIS — M25531 Pain in right wrist: Secondary | ICD-10-CM | POA: Diagnosis not present

## 2015-06-25 DIAGNOSIS — E785 Hyperlipidemia, unspecified: Secondary | ICD-10-CM

## 2015-06-25 DIAGNOSIS — Z23 Encounter for immunization: Secondary | ICD-10-CM

## 2015-06-25 DIAGNOSIS — M79641 Pain in right hand: Secondary | ICD-10-CM | POA: Diagnosis not present

## 2015-06-25 LAB — BASIC METABOLIC PANEL
BUN: 18 mg/dL (ref 6–23)
CHLORIDE: 105 meq/L (ref 96–112)
CO2: 27 meq/L (ref 19–32)
CREATININE: 1.24 mg/dL (ref 0.40–1.50)
Calcium: 9.7 mg/dL (ref 8.4–10.5)
GFR: 60.19 mL/min (ref >60.00)
Glucose, Bld: 119 mg/dL — ABNORMAL HIGH (ref 70–99)
Potassium: 4.3 mEq/L (ref 3.5–5.1)
Sodium: 139 mEq/L (ref 135–145)

## 2015-06-25 LAB — AST: AST: 17 U/L (ref 0–37)

## 2015-06-25 LAB — LIPID PANEL
Cholesterol: 165 mg/dL (ref 0–200)
HDL: 33 mg/dL — AB (ref >39.00)
NONHDL: 132.21
TRIGLYCERIDES: 323 mg/dL — AB (ref 0.0–149.0)
Total CHOL/HDL Ratio: 5
VLDL: 64.6 mg/dL — AB (ref 0.0–40.0)

## 2015-06-25 LAB — LDL CHOLESTEROL, DIRECT: Direct LDL: 63 mg/dL

## 2015-06-25 LAB — ALT: ALT: 19 U/L (ref 0–53)

## 2015-06-25 LAB — HEMOGLOBIN A1C: HEMOGLOBIN A1C: 5.7 % (ref 4.6–6.5)

## 2015-06-25 MED ORDER — MELOXICAM 7.5 MG PO TABS: 7.5000 mg | ORAL_TABLET | Freq: Every day | ORAL | Status: DC | PRN

## 2015-06-25 MED ORDER — DICLOFENAC SODIUM 1 % TD GEL: 2.0000 g | Freq: Four times a day (QID) | TRANSDERMAL | Status: DC

## 2015-06-25 NOTE — Progress Notes (Signed)
Subjective:    Patient ID: Damon Foley, male    DOB: 09-21-1940, 75 y.o.   MRN: [id number]  DOS:  06/25/2015 Type of visit - description :    Here for Medicare AWV:   1. Risk factors based on Past M, S, F history: reviewed   2. Physical Activities: volunteers x3 /week at the hospital, active , no routine exercise   3. Depression/mood:  Neg screen   4. Hearing: has hearing aids   5. ADL's: totally independent, drives   6. Fall Risk: no recent falls, Foley instructions   7. Home Safety: feels safe at home   8. Height, weight, &visual acuity: Foley VS, vision well corrected w/ glasses, sees eye doctor 1 per year 9. Counseling: yes   10. Labs ordered based on risk factors: yes   11. Referral Coordination, if needed   12. Care Plan, Foley a/p   13. Cognitive Assessment , motor skills, memory and cognition seems within normal  14. Care team updated 15. Written plan provided  16. HC-POA-- has one already, encouraged to bring a copy  We also discussed the following: HTN: Good compliance with medication, not ambulatory BPs MSK, DJD: Continue with pain mostly at the right wrist and hand. Using Tylenol, meloxicam, voltaren.  There one that seems to help the most is Voltaren gel Hypothyroidism: Good compliance with meds High cholesterol: On simvastatin, no apparent side effects  Review of Systems Constitutional: No fever. No chills. No unexplained wt changes. No unusual sweats  HEENT: No dental problems, no ear discharge, no facial swelling, no voice changes. No eye discharge, no eye  redness , no  intolerance to light   Respiratory: No wheezing , no  difficulty breathing. No cough , no mucus production  Cardiovascular: No CP, no leg swelling , no  Palpitations  GI: no nausea, no vomiting, no diarrhea , no  abdominal pain.  No blood in the stools. No dysphagia, no odynophagia    Endocrine: No polyphagia, no polyuria , no polydipsia  GU: No dysuria, gross hematuria, difficulty  urinating. Occasional nocturia without urgency or frequency..  Musculoskeletal: Continue with aches-pains @ shoulders, hands, most noticeable right wrist and hand. Denies hands or wrist swelling, redness or warmness  Skin: No change in the color of the skin, palor , no  Rash  Allergic, immunologic: No environmental allergies , no  food allergies  Neurological: No dizziness no  syncope. No headaches. No diplopia, no slurred, no slurred speech, no motor deficits, no facial  Numbness  Hematological: No enlarged lymph nodes, no easy bruising , no unusual bleedings  Psychiatry: No suicidal ideas, no hallucinations, no beavior problems, no confusion.  No unusual/severe anxiety, no depression   Past Medical History  Diagnosis Date  . Hypoparathyroidism ([place]) 2004  . ETOH abuse 1991    quit  . Carotid bruit 2002    per chart review, right, neg u/s  . Diverticulitis   . GERD (gastroesophageal reflux disease)   . Hyperlipidemia   . Hypertension   . Hypothyroidism   . Renal calculus   . Benign prostatic hypertrophy   . Tonsillar tag 2016    Dr. Redmond Baseman, ENT  . 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 Cholecystectomy    . Kidney stone surgery  2006    right kidney stone removed, had stent, did not work,  . Hydrocele excision / repair  [id number]  LEFT    Social History   Social History  . Marital Status: Divorced    Spouse Name: N/A  . Number of Children: 1  . Years of Education: N/A   Occupational History  . retired , volunteers at [employer]    .     Social History Main Topics  . Smoking status: Former Smoker    Quit date: 04/10/1989  . Smokeless tobacco: Never Used  . Alcohol Use: No     Comment: quit - 04/1989  . Drug Use: No  . Sexual Activity: Not on file   Other Topics Concern  . Not on file   Social History Narrative   Divorced lives by self    one daughter in [place] to [place] in  May 2008 from [place]      Family History  Problem Relation Age of Onset  . Heart attack Mother 67  . Prostate cancer Father 31  . Cancer Brother     type?  . Diabetes Neg Hx   . Colon cancer Neg Hx        Medication List       This list is accurate as of: 06/25/15  9:17 AM.  Always use your most recent med list.               atenolol 50 MG tablet  Commonly known as:  TENORMIN  Take 1 tablet (50 mg total) by mouth daily.     ciprofloxacin-dexamethasone otic suspension  Commonly known as:  CIPRODEX  Place 4 drops into both ears 2 (two) times daily.     levothyroxine 125 MCG tablet  Commonly known as:  SYNTHROID, LEVOTHROID  Take 1 tablet (125 mcg total) by mouth daily.     lisinopril 40 MG tablet  Commonly known as:  PRINIVIL,ZESTRIL  Take 1 tablet (40 mg total) by mouth daily.     meloxicam 7.5 MG tablet  Commonly known as:  MOBIC  Take 1-2 tablets (7.5-15 mg total) by mouth daily as needed for pain.     silodosin 8 MG Caps capsule  Commonly known as:  RAPAFLO  Take 1 capsule (8 mg total) by mouth daily with breakfast.     simvastatin 40 MG tablet  Commonly known as:  ZOCOR  Take 1 tablet (40 mg total) by mouth at bedtime.           Objective:   Physical Exam BP 128/76 mmHg  Pulse 55  Temp(Src) 97.8 F (36.6 C) (Oral)  Ht 5\' 11"  (1.803 m)  Wt 224 lb 2 oz (101.662 kg)  BMI 31.27 kg/m2  SpO2 97%  General:   Well developed, well nourished . NAD.  Neck: No  thyromegaly , normal carotid pulse HEENT:  Normocephalic . Face symmetric, atraumatic Lungs:  CTA B Normal respiratory effort, no intercostal retractions, no accessory muscle use. Heart: RRR,  no murmur.  No pretibial edema bilaterally  Abdomen:  Not distended, soft, non-tender. No rebound or rigidity.   Skin: Exposed areas without rash. Not pale. Not jaundice MSK: Hands and wrists without synovitis on exam Neurologic:  alert & oriented X3.  Speech normal, gait appropriate for age and  unassisted Strength symmetric and appropriate for age.  Psych: Cognition and judgment appear intact.  Cooperative with normal attention span and concentration.  Behavior appropriate. No anxious or depressed appearing.    Assessment & Plan:   Assessment >> HTN Hyperlipidemia Hypothyroidism GU: BPH, renal calculus. On rapaflo, has residual sx (  nocturia) GI: GERD, diverticulitis DJD H/o hyperparathyroidism 2004 (h/o elevated PTH, normalized, last PTH 04-17-14) H/o  EtOH quit 1991 H/o  Tonsillar tag saw ENT 2016 H/o carotid bruit 2002, neg  Ultrasound    PLAN HTN: Seems well-controlled, continue Tenormin, lisinopril, check a BMP. Hyperlipidemia: Continue simvastatin, check FLP, AST, ALT Hypothyroidism: Last TSH satisfactory BPH: Continue Rapaflo, only residual symptom is nocturia DJD: We have extensive conversation about treatment, plan: judicious use of Tylenol 500 mg  3 times a day, meloxicam as needed, Voltaren gel. RTC 6 months

## 2015-06-25 NOTE — Progress Notes (Signed)
Pre visit review using our clinic review tool, if applicable. No additional management support is needed unless otherwise documented below in the visit note. 

## 2015-06-25 NOTE — Assessment & Plan Note (Addendum)
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

## 2015-06-25 NOTE — Patient Instructions (Signed)
GO TO THE LAB :      Get the blood work     GO TO THE FRONT DESK Schedule your next appointment for a  Follow up When?   6 months Fasting?  No  Take Tylenol 500 mg 2 tablets 3 times a day as needed Take meloxicam as needed voltaren gel  as needed       Fall Prevention and Home Safety Falls cause injuries and can affect all age groups. It is possible to use preventive measures to significantly decrease the likelihood of falls. There are many simple measures which can make your home safer and prevent falls. OUTDOORS  Repair cracks and edges of walkways and driveways.  Remove high doorway thresholds.  Trim shrubbery on the main path into your home.  Have good outside lighting.  Clear walkways of tools, rocks, debris, and clutter.  Check that handrails are not broken and are securely fastened. Both sides of steps should have handrails.  Have leaves, snow, and ice cleared regularly.  Use sand or salt on walkways during winter months.  In the garage, clean up grease or oil spills. BATHROOM  Install night lights.  Install grab bars by the toilet and in the tub and shower.  Use non-skid mats or decals in the tub or shower.  Place a plastic non-slip stool in the shower to sit on, if needed.  Keep floors dry and clean up all water on the floor immediately.  Remove soap buildup in the tub or shower on a regular basis.  Secure bath mats with non-slip, double-sided rug tape.  Remove throw rugs and tripping hazards from the floors. BEDROOMS  Install night lights.  Make sure a bedside light is easy to reach.  Do not use oversized bedding.  Keep a telephone by your bedside.  Have a firm chair with side arms to use for getting dressed.  Remove throw rugs and tripping hazards from the floor. KITCHEN  Keep handles on pots and pans turned toward the center of the stove. Use back burners when possible.  Clean up spills quickly and allow time for drying.  Avoid  walking on wet floors.  Avoid hot utensils and knives.  Position shelves so they are not too high or low.  Place commonly used objects within easy reach.  If necessary, use a sturdy step stool with a grab bar when reaching.  Keep electrical cables out of the way.  Do not use floor polish or wax that makes floors slippery. If you must use wax, use non-skid floor wax.  Remove throw rugs and tripping hazards from the floor. STAIRWAYS  Never leave objects on stairs.  Place handrails on both sides of stairways and use them. Fix any loose handrails. Make sure handrails on both sides of the stairways are as long as the stairs.  Check carpeting to make sure it is firmly attached along stairs. Make repairs to worn or loose carpet promptly.  Avoid placing throw rugs at the top or bottom of stairways, or properly secure the rug with carpet tape to prevent slippage. Get rid of throw rugs, if possible.  Have an electrician put in a light switch at the top and bottom of the stairs. OTHER FALL PREVENTION TIPS  Wear low-heel or rubber-soled shoes that are supportive and fit well. Wear closed toe shoes.  When using a stepladder, make sure it is fully opened and both spreaders are firmly locked. Do not climb a closed stepladder.  Add color or  contrast paint or tape to grab bars and handrails in your home. Place contrasting color strips on first and last steps.  Learn and use mobility aids as needed. Install an electrical emergency response system.  Turn on lights to avoid dark areas. Replace light bulbs that burn out immediately. Get light switches that glow.  Arrange furniture to create clear pathways. Keep furniture in the same place.  Firmly attach carpet with non-skid or double-sided tape.  Eliminate uneven floor surfaces.  Select a carpet pattern that does not visually hide the edge of steps.  Be aware of all pets. OTHER HOME SAFETY TIPS  Set the water temperature for 120 F  (48.8 C).  Keep emergency numbers on or near the telephone.  Keep smoke detectors on every level of the home and near sleeping areas. Document Released: 03/17/2002 Document Revised: 09/26/2011 Document Reviewed: 06/16/2011 [facility] Patient Information 2015 [place], [place]. This information is not intended to replace advice given to you by your health care provider. Make sure you discuss any questions you have with your health care provider.   Preventive Care for Adults Ages 24 and over  Blood pressure check.** / Every 1 to 2 years.  Lipid and cholesterol check.**/ Every 5 years beginning at age 32.  Lung cancer screening. / Every year if you are aged 63-80 years and have a 30-pack-year history of smoking and currently smoke or have quit within the past 15 years. Yearly screening is stopped once you have quit smoking for at least 15 years or develop a health problem that would prevent you from having lung cancer treatment.  Fecal occult blood test (FOBT) of stool. / Every year beginning at age 72 and continuing until age 79. You may not have to do this test if you get a colonoscopy every 10 years.  Flexible sigmoidoscopy** or colonoscopy.** / Every 5 years for a flexible sigmoidoscopy or every 10 years for a colonoscopy beginning at age 15 and continuing until age 42.  Hepatitis C blood test.** / For all people born from [birth year] through 1967 and any individual with known risks for hepatitis C.  Abdominal aortic aneurysm (AAA) screening.** / A one-time screening for ages 42 to 63 years who are current or former smokers.  Skin self-exam. / Monthly.  Influenza vaccine. / Every year.  Tetanus, diphtheria, and acellular pertussis (Tdap/Td) vaccine.** / 1 dose of Td every 10 years.  Varicella vaccine.** / Consult your health care provider.  Zoster vaccine.** / 1 dose for adults aged 57 years or older.  Pneumococcal 13-valent conjugate (PCV13) vaccine.** / Consult your health care  provider.  Pneumococcal polysaccharide (PPSV23) vaccine.** / 1 dose for all adults aged 50 years and older.  Meningococcal vaccine.** / Consult your health care provider.  Hepatitis A vaccine.** / Consult your health care provider.  Hepatitis B vaccine.** / Consult your health care provider.  Haemophilus influenzae type b (Hib) vaccine.** / Consult your health care provider. **Family history and personal history of risk and conditions may change your health care provider's recommendations. Document Released: 05/23/2001 Document Revised: 04/01/2013 Document Reviewed: 08/22/2010 [facility] Patient Information 2015 [place], [place]. This information is not intended to replace advice given to you by your health care provider. Make sure you discuss any questions you have with your health care provider.

## 2015-06-26 NOTE — Assessment & Plan Note (Signed)
HTN: Seems well-controlled, continue Tenormin, lisinopril, check a BMP. Hyperlipidemia: Continue simvastatin, check FLP, AST, ALT Hypothyroidism: Last TSH satisfactory BPH: Continue Rapaflo, only residual symptom is nocturia DJD: We have extensive conversation about treatment, plan: judicious use of Tylenol 500 mg  3 times a day, meloxicam as needed, Voltaren gel. RTC 6 months

## 2015-12-09 ENCOUNTER — Ambulatory Visit (INDEPENDENT_AMBULATORY_CARE_PROVIDER_SITE_OTHER): Payer: Medicare Other | Admitting: Internal Medicine

## 2015-12-09 ENCOUNTER — Encounter: Payer: Self-pay | Admitting: Internal Medicine

## 2015-12-09 ENCOUNTER — Telehealth: Payer: Self-pay | Admitting: Family Medicine

## 2015-12-09 ENCOUNTER — Ambulatory Visit (HOSPITAL_BASED_OUTPATIENT_CLINIC_OR_DEPARTMENT_OTHER)
Admission: RE | Admit: 2015-12-09 | Discharge: 2015-12-09 | Disposition: A | Discharge status: Home / Self Care | Payer: Medicare Other | Source: Ambulatory Visit | Attending: Internal Medicine | Admitting: Internal Medicine

## 2015-12-09 ENCOUNTER — Other Ambulatory Visit: Payer: Self-pay | Admitting: Family Medicine

## 2015-12-09 VITALS — BP 124/76 | HR 75 | Temp 98.1°F | Resp 14 | Ht 71.0 in | Wt 221.0 lb

## 2015-12-09 DIAGNOSIS — I749 Embolism and thrombosis of unspecified artery: Secondary | ICD-10-CM | POA: Insufficient documentation

## 2015-12-09 DIAGNOSIS — H349 Unspecified retinal vascular occlusion: Secondary | ICD-10-CM | POA: Diagnosis not present

## 2015-12-09 DIAGNOSIS — I10 Essential (primary) hypertension: Secondary | ICD-10-CM

## 2015-12-09 DIAGNOSIS — H539 Unspecified visual disturbance: Secondary | ICD-10-CM | POA: Diagnosis not present

## 2015-12-09 DIAGNOSIS — H547 Unspecified visual loss: Secondary | ICD-10-CM

## 2015-12-09 DIAGNOSIS — E782 Mixed hyperlipidemia: Secondary | ICD-10-CM

## 2015-12-09 DIAGNOSIS — I6523 Occlusion and stenosis of bilateral carotid arteries: Secondary | ICD-10-CM | POA: Insufficient documentation

## 2015-12-09 DIAGNOSIS — H34211 Partial retinal artery occlusion, right eye: Secondary | ICD-10-CM | POA: Diagnosis not present

## 2015-12-09 DIAGNOSIS — R739 Hyperglycemia, unspecified: Secondary | ICD-10-CM

## 2015-12-09 NOTE — Telephone Encounter (Signed)
Patient seen by PMD then by opthamology for visual loss unilaterally, improving today. 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 It was agreed patient would return to [facility] for labs , carotid dopplers and possible echo and CT head but patient did not return. Left message for him to seek care over night and to contact office in am to complete testing.

## 2015-12-09 NOTE — Progress Notes (Signed)
Pre visit review using our clinic review tool, if applicable. No additional management support is needed unless otherwise documented below in the visit note. 

## 2015-12-09 NOTE — Progress Notes (Signed)
Subjective:    Patient ID: Damon Foley, male    DOB: [birth year], 75 y.o.   MRN: [id number]  DOS:  12/09/2015 Type of visit - description : Acute visit Interval history: Last night, he was at home, suddenly the right vision got completely blurred, it lasted a few minutes and then the half top R field  was still cloudy and the bottom was clear. He did not experience any other symptoms. Today, he has horizontal band of cloudy vision @ R field when he tries to read.  Review of Systems Denies chest pain, palpitations. No nausea or vomiting No headaches, double vision, slurred speech, motor deficits, facial numbness.  Past Medical History:  Diagnosis Date  . BCC (basal cell carcinoma) 2016   L preauricular/sideburn  . Benign prostatic hypertrophy   . Carotid bruit 2002   per chart review, right, neg u/s  . Diverticulitis   . ETOH abuse 1991   quit  . GERD (gastroesophageal reflux disease)   . Hyperlipidemia   . Hypertension   . 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 Hypothyroidism   . Renal calculus   . Tonsillar tag 2016   Dr. Redmond Baseman, ENT    Past Surgical History:  Procedure Laterality Date  . CHOLECYSTECTOMY    . dental implants     04-2015  . HYDROCELE EXCISION / REPAIR  1987   LEFT  . KIDNEY STONE SURGERY  2006   right kidney stone removed, had stent, did not work,    Social History   Social History  . Marital status: Divorced    Spouse name: N/A  . Number of children: 1  . Years of education: N/A   Occupational History  . retired , volunteers at [employer]    .  Retired   Social History Main Topics  . Smoking status: Former Smoker    Quit date: 04/10/1989  . Smokeless tobacco: Never Used  . Alcohol use No     Comment: quit - 04/1989  . Drug use: No  . Sexual activity: Not on file   Other Topics Concern  . Not on file   Social History Narrative   Divorced lives by self    one daughter in [place] to [place] in May 2008 from [place]           Medication List       Accurate as of 12/09/15 11:59 PM. Always use your most recent med list.          atenolol 50 MG tablet Commonly known as:  TENORMIN Take 1 tablet (50 mg total) by mouth daily.   diclofenac sodium 1 % Gel Commonly known as:  VOLTAREN Apply 2 g topically 4 (four) times daily.   levothyroxine 125 MCG tablet Commonly known as:  SYNTHROID, LEVOTHROID Take 1 tablet (125 mcg total) by mouth daily.   lisinopril 40 MG tablet Commonly known as:  PRINIVIL,ZESTRIL Take 1 tablet (40 mg total) by mouth daily.   meloxicam 7.5 MG tablet Commonly known as:  MOBIC Take 1-2 tablets (7.5-15 mg total) by mouth daily as needed for pain.   silodosin 8 MG Caps capsule Commonly known as:  RAPAFLO Take 1 capsule (8 mg total) by mouth daily with breakfast.   simvastatin 40 MG tablet Commonly known as:  ZOCOR Take 1 tablet (40 mg total) by mouth at bedtime.          Objective:   Physical Exam BP 124/76 (BP Location: Left Arm, Patient  Position: Sitting, Cuff Size: Normal)   Pulse 75   Temp 98.1 F (36.7 C) (Oral)   Resp 14   Ht 5\' 11"  (1.803 m)   Wt 221 lb (100.2 kg)   SpO2 98%   BMI 30.82 kg/m  General:   Well developed, well nourished . NAD.  HEENT:  Normocephalic . Face symmetric, atraumatic Neck: Normal carotid pulses without bruit EOMI, pupils symmetric, small but reactive. Anterior chambers normal. Lungs:  CTA B Normal respiratory effort, no intercostal retractions, no accessory muscle use. Heart: RRR,  no murmur.  No pretibial edema bilaterally  Skin: Not pale. Not jaundice Neurologic:  alert & oriented X3.  Speech normal, gait appropriate for age and unassisted. Motor symmetric Psych--  Cognition and judgment appear intact.  Cooperative with normal attention span and concentration.  Behavior appropriate. No anxious or depressed appearing.      Assessment & Plan:   Assessment >> HTN Hyperlipidemia Hypothyroidism GU: BPH, renal  calculus. On rapaflo, has residual sx (nocturia) GI: GERD, diverticulitis DJD H/o hyperparathyroidism 2004 (h/o elevated PTH, normalized, last PTH 04-17-14) H/o  EtOH quit 1991 H/o  Tonsillar tag saw ENT 2016 H/o carotid bruit 2002, neg  Ultrasound    PLAN Visual disturbances, right eye: Needs to [facility] ophthalmology ASAP, hopefully today. Depending on the diagnosis may need further eval such as echocardiogram and a carotid ultrasound. Until then recommend to go to the ER if symptoms increase, [facility] AVS. Follow-up next month as scheduled.  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 Had a carotid ultrasound yesterday w/ minimal amount of atherosclerosis right greater than left. Will get a Echocardiogram, brain MRI  Cont aspirin 81 (d/w neuro informally, higher dose of ASA not likely beneficial,  consider switch to plavix or aggrenox depending on the CV RF burden -pending labs) Recheck a CMP, CBC, FLP, TSH, A1C , sed rate ---- labs schedule for 12-14-15 -3 pm ER if change in status Discussed plan w/ pt in detail   Damon November, MD     I spent more than 45   min with the patient: >50% of the time counseling regards ophthalmology findings, risk of strokes, indicationd for ER visit, reviewing [place] results and coordinating his care

## 2015-12-09 NOTE — Patient Instructions (Signed)
We are getting you to see the eye doctor ASAP.  If you have any worsening of the symptoms, headache, nausea, face numbness, difficulty with your speech or any stroke symptoms ----------->  go to the ER

## 2015-12-10 ENCOUNTER — Telehealth: Payer: Self-pay

## 2015-12-10 DIAGNOSIS — H34211 Partial retinal artery occlusion, right eye: Secondary | ICD-10-CM

## 2015-12-10 NOTE — Telephone Encounter (Signed)
Spoke w/ pt , discussed plan in great detail (see office visit note)

## 2015-12-10 NOTE — Addendum Note (Signed)
Addended byDamita Dunnings D on: 12/10/2015 07:34 AM   Modules accepted: Orders

## 2015-12-10 NOTE — Telephone Encounter (Signed)
Forwarding to PCP.

## 2015-12-10 NOTE — Telephone Encounter (Signed)
-----   Message from Quintin Alto sent at 12/10/2015  9:21 AM EDT ----- Regarding: Test Results Contact: [phone] Please call patient to discuss test results from imaging yesterday

## 2015-12-10 NOTE — Telephone Encounter (Signed)
LMOM

## 2015-12-10 NOTE — Telephone Encounter (Signed)
Per PCP, Pt needs labs: sed rate, CMP, CBC w/, FLP, TSH, A1C. Also needs MRI Brain w/o, and ECHO. Orders placed.

## 2015-12-12 NOTE — Assessment & Plan Note (Signed)
  Visual disturbances, right eye: Needs to see ophthalmology ASAP, hopefully today. Depending on the diagnosis may need further eval such as echocardiogram and a carotid ultrasound. Until then recommend to go to the ER if symptoms increase, see AVS. Follow-up next month as scheduled.  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 Had a carotid ultrasound yesterday w/ minimal amount of atherosclerosis right greater than left. Will get a Echocardiogram, brain MRI  Cont aspirin 81 (d/w neuro informally, higher dose of ASA not likely beneficial,  consider switch to plavix or aggrenox depending on the CV RF burden -pending labs) Recheck a CMP, CBC, FLP, TSH, A1C , sed rate ---- labs schedule for 12-14-15 -3 pm ER if change in status Discussed plan w/ pt in detail

## 2015-12-14 ENCOUNTER — Encounter (HOSPITAL_BASED_OUTPATIENT_CLINIC_OR_DEPARTMENT_OTHER): Payer: Self-pay

## 2015-12-14 ENCOUNTER — Emergency Department (HOSPITAL_BASED_OUTPATIENT_CLINIC_OR_DEPARTMENT_OTHER): Payer: Medicare Other

## 2015-12-14 ENCOUNTER — Emergency Department (HOSPITAL_BASED_OUTPATIENT_CLINIC_OR_DEPARTMENT_OTHER)
Admission: EM | Admit: 2015-12-14 | Discharge: 2015-12-14 | Disposition: A | Discharge status: Home / Self Care | Payer: Medicare Other | Attending: Emergency Medicine | Admitting: Emergency Medicine

## 2015-12-14 ENCOUNTER — Other Ambulatory Visit (INDEPENDENT_AMBULATORY_CARE_PROVIDER_SITE_OTHER): Payer: Medicare Other

## 2015-12-14 DIAGNOSIS — I10 Essential (primary) hypertension: Secondary | ICD-10-CM | POA: Diagnosis not present

## 2015-12-14 DIAGNOSIS — Z79899 Other long term (current) drug therapy: Secondary | ICD-10-CM | POA: Diagnosis not present

## 2015-12-14 DIAGNOSIS — Z87891 Personal history of nicotine dependence: Secondary | ICD-10-CM | POA: Diagnosis not present

## 2015-12-14 DIAGNOSIS — Z85828 Personal history of other malignant neoplasm of skin: Secondary | ICD-10-CM | POA: Insufficient documentation

## 2015-12-14 DIAGNOSIS — E039 Hypothyroidism, unspecified: Secondary | ICD-10-CM | POA: Insufficient documentation

## 2015-12-14 DIAGNOSIS — H34211 Partial retinal artery occlusion, right eye: Secondary | ICD-10-CM

## 2015-12-14 DIAGNOSIS — R739 Hyperglycemia, unspecified: Secondary | ICD-10-CM

## 2015-12-14 DIAGNOSIS — Z7982 Long term (current) use of aspirin: Secondary | ICD-10-CM | POA: Insufficient documentation

## 2015-12-14 DIAGNOSIS — R600 Localized edema: Secondary | ICD-10-CM | POA: Diagnosis not present

## 2015-12-14 DIAGNOSIS — M25561 Pain in right knee: Secondary | ICD-10-CM

## 2015-12-14 LAB — COMPREHENSIVE METABOLIC PANEL
ALT: 31 U/L (ref 0–53)
AST: 21 U/L (ref 0–37)
Albumin: 4.5 g/dL (ref 3.5–5.2)
Alkaline Phosphatase: 36 U/L — ABNORMAL LOW (ref 39–117)
BILIRUBIN TOTAL: 1.3 mg/dL — AB (ref 0.2–1.2)
BUN: 18 mg/dL (ref 6–23)
CALCIUM: 9.4 mg/dL (ref 8.4–10.5)
CO2: 27 meq/L (ref 19–32)
Chloride: 104 mEq/L (ref 96–112)
Creatinine, Ser: 1.27 mg/dL (ref 0.40–1.50)
GFR: 58.48 mL/min — AB (ref >60.00)
Glucose, Bld: 107 mg/dL — ABNORMAL HIGH (ref 70–99)
POTASSIUM: 4.3 meq/L (ref 3.5–5.1)
Sodium: 137 mEq/L (ref 135–145)
Total Protein: 6.9 g/dL (ref 6.0–8.3)

## 2015-12-14 LAB — CBC WITH DIFFERENTIAL/PLATELET
BASOS ABS: 0 10*3/uL (ref 0.0–0.1)
Basophils Relative: 0.5 % (ref 0.0–3.0)
EOS ABS: 0.1 10*3/uL (ref 0.0–0.7)
Eosinophils Relative: 2.3 % (ref 0.0–5.0)
HEMATOCRIT: 45.8 % (ref 39.0–52.0)
Hemoglobin: 16.1 g/dL (ref 13.0–17.0)
LYMPHS ABS: 1.1 10*3/uL (ref 0.7–4.0)
LYMPHS PCT: 22.5 % (ref 12.0–46.0)
MCHC: 35.1 g/dL (ref 30.0–36.0)
MCV: 93.3 fl (ref 78.0–100.0)
MONO ABS: 0.5 10*3/uL (ref 0.1–1.0)
Monocytes Relative: 9.6 % (ref 3.0–12.0)
NEUTROS ABS: 3.2 10*3/uL (ref 1.4–7.7)
NEUTROS PCT: 65.1 % (ref 43.0–77.0)
PLATELETS: 160 10*3/uL (ref 150.0–400.0)
RBC: 4.91 Mil/uL (ref 4.22–5.81)
RDW: 13.2 % (ref 11.5–15.5)
WBC: 4.9 10*3/uL (ref 4.0–10.5)

## 2015-12-14 LAB — LIPID PANEL
CHOL/HDL RATIO: 4
Cholesterol: 160 mg/dL (ref 0–200)
HDL: 40.5 mg/dL (ref >39.00)
LDL CALC: 85 mg/dL (ref 0–99)
NONHDL: 119.56
TRIGLYCERIDES: 172 mg/dL — AB (ref 0.0–149.0)
VLDL: 34.4 mg/dL (ref 0.0–40.0)

## 2015-12-14 LAB — HEMOGLOBIN A1C: HEMOGLOBIN A1C: 5.4 % (ref 4.6–6.5)

## 2015-12-14 LAB — TSH: TSH: 4.8 u[IU]/mL — AB (ref 0.35–4.50)

## 2015-12-14 NOTE — ED Triage Notes (Signed)
C/o pain to right knee "for a while"-was walking today and knee "clicked"-increase in pain-NAD-steady gait

## 2015-12-14 NOTE — ED Notes (Signed)
Pa  at bedside. 

## 2015-12-14 NOTE — ED Provider Notes (Signed)
[facility] Provider Note   CSN: [id number] Arrival date & time: 12/14/15  1449  By signing my name below, I, Shanna Cisco, attest that this documentation has been prepared under the direction and in the presence of Jackson Latino, PA-C. Electronically signed by: Shanna Cisco, ED Scribe. 12/14/15. 5:18 PM.  History   Chief Complaint Chief Complaint  Patient presents with  . Knee Pain   The history is provided by the patient. No language interpreter was used.   HPI Comments:  Damon Foley is a 75 y.o. male with arthritis who presents to the Emergency Department complaining of intermittent, moderate right knee pain, which started 2 months ago. He reports that the knee is normally stiff after sitting for long periods of time. Pt reports that he was walking today and heard his knee "click," which increased pain. Associated symptoms include pain with walking and weight bearing. Denies radiation of pain, swelling in the affected area, numbness or tingling, fevers.    Past Medical History:  Diagnosis Date  . BCC (basal cell carcinoma) 2016   L preauricular/sideburn  . Benign prostatic hypertrophy   . Carotid bruit 2002   per chart review, right, neg u/s  . Diverticulitis   . ETOH abuse 1991   quit  . GERD (gastroesophageal reflux disease)   . Hyperlipidemia   . Hypertension   . 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 Hypothyroidism   . Renal calculus   . Tonsillar tag 2016   Dr. Redmond Baseman, ENT    Patient Active Problem List   Diagnosis Date Noted  . Visual disturbance 12/09/2015  . Bilateral shoulder pain 01/22/2015  . Follow-up--------------PCP notes 12/18/2014  . Cerumen impaction 11/11/2013  . Annual physical exam 02/20/2011  . Hypertension 06/11/2008  . HYPERGLYCEMIA, BORDERLINE 06/11/2008  . Hyperparathyroidism ([place]) 12/13/2007  . Hypothyroidism 06/24/2007  . Hyperlipidemia 09/25/2006  . GERD 09/25/2006  . BPH (benign prostatic hyperplasia) 09/25/2006  .  DIVERTICULITIS, HX OF 09/25/2006  . RENAL CALCULUS, HX OF 09/25/2006    Past Surgical History:  Procedure Laterality Date  . CHOLECYSTECTOMY    . dental implants     04-2015  . HYDROCELE EXCISION / REPAIR  1987   LEFT  . KIDNEY STONE SURGERY  2006   right kidney stone removed, had stent, did not work,       Home Medications    Prior to Admission medications   Medication Sig Start Date End Date Taking? Authorizing Provider  aspirin EC 81 MG tablet Take 81 mg by mouth daily.    Historical Provider, MD  atenolol (TENORMIN) 50 MG tablet Take 1 tablet (50 mg total) by mouth daily. 01/18/15   Colon Branch, MD  diclofenac sodium (VOLTAREN) 1 % GEL Apply 2 g topically 4 (four) times daily. 06/25/15   Colon Branch, MD  levothyroxine (SYNTHROID, LEVOTHROID) 125 MCG tablet Take 1 tablet (125 mcg total) by mouth daily. 01/18/15   Colon Branch, MD  lisinopril (PRINIVIL,ZESTRIL) 40 MG tablet Take 1 tablet (40 mg total) by mouth daily. 01/18/15   Colon Branch, MD  meloxicam (MOBIC) 7.5 MG tablet Take 1-2 tablets (7.5-15 mg total) by mouth daily as needed for pain. 06/25/15   Colon Branch, MD  silodosin (RAPAFLO) 8 MG CAPS capsule Take 1 capsule (8 mg total) by mouth daily with breakfast. 01/18/15   Colon Branch, MD  simvastatin (ZOCOR) 40 MG tablet Take 1 tablet (40 mg total) by mouth at bedtime. 01/18/15  Colon Branch, MD    Family History Family History  Problem Relation Age of Onset  . Heart attack Mother 42  . Prostate cancer Father 30  . Cancer Brother     type?  . Diabetes Neg Hx   . Colon cancer Neg Hx     Social History Social History  Substance Use Topics  . Smoking status: Former Smoker    Quit date: 04/10/1989  . Smokeless tobacco: Never Used  . Alcohol use No     Comment: quit - 04/1989     Allergies   Review of patient's allergies indicates no known allergies.   Review of Systems Review of Systems  Musculoskeletal: Positive for arthralgias. Negative for joint swelling.    Neurological: Negative for numbness.     Physical Exam Updated Vital Signs BP 177/98 (BP Location: Left Arm)   Pulse 64   Temp 97.7 F (36.5 C) (Oral)   Resp 18   Ht 5\' 11"  (1.803 m)   Wt 220 lb (99.8 kg)   SpO2 99%   BMI 30.68 kg/m   Physical Exam  Constitutional: He appears well-developed and well-nourished. No distress.  HENT:  Head: Normocephalic and atraumatic.  Eyes: Conjunctivae are normal.  Pulmonary/Chest: Effort normal. No respiratory distress.  Musculoskeletal: Normal range of motion. He exhibits no edema or deformity.  Examination of right knee revealed no deformities, erythema, warmth, joint effusion. Mild edema noted to the patellar region. Full AROM. No TTP of joint lines or patellar tendon. Crepitus noted under patella with flexion and extension. Patient is neurovascularly intact distally bilaterally  Neurological: He is alert. Coordination normal.  Skin: Skin is warm and dry. He is not diaphoretic.  Psychiatric: He has a normal mood and affect. His behavior is normal.  Nursing note and vitals reviewed.    ED Treatments / Results  DIAGNOSTIC STUDIES:  Oxygen Saturation is 99% on room air, normal by my interpretation.    COORDINATION OF CARE:  5:18 PM Discussed treatment plan with pt at bedside and pt agreed to plan.  Labs (all labs ordered are listed, but only abnormal results are displayed) Labs Reviewed - No data to display  EKG  EKG Interpretation None       Radiology Dg Knee Complete 4 Views Right  Result Date: 12/14/2015 CLINICAL DATA:  Right knee pain with clicking for 3 weeks. Instability. EXAM: RIGHT KNEE - COMPLETE 4+ VIEW COMPARISON:  None. FINDINGS: Mild articular space narrowing in the medial compartment. Vascular calcifications noted. No overt knee effusion. Minimal prepatellar subcutaneous edema. IMPRESSION: 1. Minimal prepatellar subcutaneous edema. Mild degenerative medial compartmental articular space narrowing. Otherwise, no  significant abnormalities are observed. If pain persists despite conservative therapy, MRI may be warranted for further characterization. Electronically Signed   By: Van Clines M.D.   On: 12/14/2015 17:06    Procedures Procedures (including critical care time)  Medications Ordered in ED Medications - No data to display   Initial Impression / Assessment and Plan / ED Course  I have reviewed the triage vital signs and the nursing notes.  Pertinent labs & imaging results that were available during my care of the patient were reviewed by me and considered in my medical decision making (see chart for details).  Clinical Course   Patient with Right knee pain. X-rays reviewed by me revealed no bony abnormality, dislocation, or fracture. X-ray revealed prepatellar subcutaneous edema. Patient is neurovascularly intact distally and able to move knee and walk although states it is  painful. Discussed RICE and pain medication. Instructed patient to follow up with their primary care provider or orthopedics within 2-3 days to have knee reevaluated. Discussed strict return precautions to the ED. patient appears reliable and expressed understanding to the discharge instructions.    I personally performed the services described in this documentation, which was scribed in my presence. The recorded information has been reviewed and is accurate.     Final Clinical Impressions(s) / ED Diagnoses   Final diagnoses:  Knee pain, right    New Prescriptions Discharge Medication List as of 12/14/2015  5:39 PM       Kalman Drape, [place] 12/14/15 1816    Varney Biles, MD 12/20/15 [phone]

## 2015-12-14 NOTE — Discharge Instructions (Signed)
Use ice on your knee as often as you can, 20 min on and 20 min off and be sure to keep a thin cloth between your skin and the ice. Follow-up with your primary care provider or Dr. Lorin Mercy, orthopedics, in 3 days if your symptoms are not improving.   Return to the emergency department if you experience swelling of your knee, increased pain, redness, warmth, numbness/timing, weakness, fever or any other concerning symptoms.

## 2015-12-15 MED ORDER — LEVOTHYROXINE SODIUM 150 MCG PO TABS: 150.0000 ug | ORAL_TABLET | Freq: Every day | ORAL | 1 refills | Status: DC

## 2015-12-15 MED ORDER — ATORVASTATIN CALCIUM 40 MG PO TABS: 40.0000 mg | ORAL_TABLET | Freq: Every day | ORAL | 1 refills | Status: DC

## 2015-12-15 NOTE — Addendum Note (Signed)
Addended byDamita Dunnings D on: 12/15/2015 11:24 AM   Modules accepted: Orders

## 2015-12-18 ENCOUNTER — Inpatient Hospital Stay (HOSPITAL_BASED_OUTPATIENT_CLINIC_OR_DEPARTMENT_OTHER)
Admission: EM | Admit: 2015-12-18 | Discharge: 2015-12-19 | DRG: 066 | Disposition: A | Discharge status: Home / Self Care | Payer: Medicare Other | Attending: Family Medicine | Admitting: Family Medicine

## 2015-12-18 ENCOUNTER — Emergency Department (HOSPITAL_BASED_OUTPATIENT_CLINIC_OR_DEPARTMENT_OTHER): Payer: Medicare Other

## 2015-12-18 ENCOUNTER — Encounter (HOSPITAL_BASED_OUTPATIENT_CLINIC_OR_DEPARTMENT_OTHER): Payer: Self-pay

## 2015-12-18 ENCOUNTER — Inpatient Hospital Stay (HOSPITAL_COMMUNITY): Payer: Medicare Other

## 2015-12-18 ENCOUNTER — Ambulatory Visit (HOSPITAL_BASED_OUTPATIENT_CLINIC_OR_DEPARTMENT_OTHER): Admission: RE | Admit: 2015-12-18 | Payer: Medicare Other | Source: Ambulatory Visit

## 2015-12-18 DIAGNOSIS — G8324 Monoplegia of upper limb affecting left nondominant side: Secondary | ICD-10-CM | POA: Diagnosis present

## 2015-12-18 DIAGNOSIS — Z8042 Family history of malignant neoplasm of prostate: Secondary | ICD-10-CM | POA: Diagnosis not present

## 2015-12-18 DIAGNOSIS — Z79899 Other long term (current) drug therapy: Secondary | ICD-10-CM | POA: Diagnosis not present

## 2015-12-18 DIAGNOSIS — I6340 Cerebral infarction due to embolism of unspecified cerebral artery: Secondary | ICD-10-CM | POA: Diagnosis present

## 2015-12-18 DIAGNOSIS — I6521 Occlusion and stenosis of right carotid artery: Secondary | ICD-10-CM | POA: Diagnosis present

## 2015-12-18 DIAGNOSIS — Z85828 Personal history of other malignant neoplasm of skin: Secondary | ICD-10-CM | POA: Diagnosis not present

## 2015-12-18 DIAGNOSIS — Z87891 Personal history of nicotine dependence: Secondary | ICD-10-CM | POA: Diagnosis not present

## 2015-12-18 DIAGNOSIS — I639 Cerebral infarction, unspecified: Principal | ICD-10-CM | POA: Diagnosis present

## 2015-12-18 DIAGNOSIS — E669 Obesity, unspecified: Secondary | ICD-10-CM | POA: Diagnosis present

## 2015-12-18 DIAGNOSIS — Z8249 Family history of ischemic heart disease and other diseases of the circulatory system: Secondary | ICD-10-CM

## 2015-12-18 DIAGNOSIS — Z7982 Long term (current) use of aspirin: Secondary | ICD-10-CM

## 2015-12-18 DIAGNOSIS — E209 Hypoparathyroidism, unspecified: Secondary | ICD-10-CM | POA: Diagnosis present

## 2015-12-18 DIAGNOSIS — K219 Gastro-esophageal reflux disease without esophagitis: Secondary | ICD-10-CM | POA: Diagnosis present

## 2015-12-18 DIAGNOSIS — E039 Hypothyroidism, unspecified: Secondary | ICD-10-CM | POA: Diagnosis present

## 2015-12-18 DIAGNOSIS — Z87442 Personal history of urinary calculi: Secondary | ICD-10-CM

## 2015-12-18 DIAGNOSIS — N400 Enlarged prostate without lower urinary tract symptoms: Secondary | ICD-10-CM | POA: Diagnosis present

## 2015-12-18 DIAGNOSIS — Z6830 Body mass index (BMI) 30.0-30.9, adult: Secondary | ICD-10-CM | POA: Diagnosis not present

## 2015-12-18 DIAGNOSIS — I10 Essential (primary) hypertension: Secondary | ICD-10-CM | POA: Diagnosis present

## 2015-12-18 DIAGNOSIS — I63231 Cerebral infarction due to unspecified occlusion or stenosis of right carotid arteries: Secondary | ICD-10-CM | POA: Diagnosis not present

## 2015-12-18 DIAGNOSIS — E785 Hyperlipidemia, unspecified: Secondary | ICD-10-CM | POA: Diagnosis present

## 2015-12-18 DIAGNOSIS — R200 Anesthesia of skin: Secondary | ICD-10-CM | POA: Diagnosis not present

## 2015-12-18 DIAGNOSIS — I63131 Cerebral infarction due to embolism of right carotid artery: Secondary | ICD-10-CM | POA: Diagnosis not present

## 2015-12-18 LAB — CBC
HCT: 42.5 % (ref 39.0–52.0)
Hemoglobin: 15.3 g/dL (ref 13.0–17.0)
MCH: 32.3 pg (ref 26.0–34.0)
MCHC: 36 g/dL (ref 30.0–36.0)
MCV: 89.7 fL (ref 78.0–100.0)
PLATELETS: 126 10*3/uL — AB (ref 150–400)
RBC: 4.74 MIL/uL (ref 4.22–5.81)
RDW: 12.5 % (ref 11.5–15.5)
WBC: 5.1 10*3/uL (ref 4.0–10.5)

## 2015-12-18 LAB — COMPREHENSIVE METABOLIC PANEL
ALT: 33 U/L (ref 17–63)
ANION GAP: 6 (ref 5–15)
AST: 26 U/L (ref 15–41)
Albumin: 4.3 g/dL (ref 3.5–5.0)
Alkaline Phosphatase: 35 U/L — ABNORMAL LOW (ref 38–126)
BUN: 23 mg/dL — ABNORMAL HIGH (ref 6–20)
CHLORIDE: 105 mmol/L (ref 101–111)
CO2: 24 mmol/L (ref 22–32)
Calcium: 9.8 mg/dL (ref 8.9–10.3)
Creatinine, Ser: 1.26 mg/dL — ABNORMAL HIGH (ref 0.61–1.24)
GFR, EST NON AFRICAN AMERICAN: 54 mL/min — AB (ref >60)
Glucose, Bld: 125 mg/dL — ABNORMAL HIGH (ref 65–99)
POTASSIUM: 3.9 mmol/L (ref 3.5–5.1)
SODIUM: 135 mmol/L (ref 135–145)
Total Bilirubin: 1.4 mg/dL — ABNORMAL HIGH (ref 0.3–1.2)
Total Protein: 6.8 g/dL (ref 6.5–8.1)

## 2015-12-18 LAB — URINALYSIS, ROUTINE W REFLEX MICROSCOPIC
Bilirubin Urine: NEGATIVE
Glucose, UA: NEGATIVE mg/dL
Hgb urine dipstick: NEGATIVE
Ketones, ur: NEGATIVE mg/dL
Leukocytes, UA: NEGATIVE
NITRITE: NEGATIVE
PROTEIN: NEGATIVE mg/dL
SPECIFIC GRAVITY, URINE: 1.019 (ref 1.005–1.030)
pH: 5 (ref 5.0–8.0)

## 2015-12-18 LAB — RAPID URINE DRUG SCREEN, HOSP PERFORMED
AMPHETAMINES: NOT DETECTED
BENZODIAZEPINES: NOT DETECTED
Barbiturates: NOT DETECTED
COCAINE: NOT DETECTED
OPIATES: NOT DETECTED
TETRAHYDROCANNABINOL: NOT DETECTED

## 2015-12-18 LAB — TROPONIN I

## 2015-12-18 MED ORDER — HYDRALAZINE HCL 20 MG/ML IJ SOLN: 10.0000 mg | Freq: Four times a day (QID) | INTRAMUSCULAR | Status: DC | PRN

## 2015-12-18 MED ORDER — TAMSULOSIN HCL 0.4 MG PO CAPS: 0.4000 mg | ORAL_CAPSULE | Freq: Every day | ORAL | Status: DC

## 2015-12-18 MED ORDER — ENOXAPARIN SODIUM 30 MG/0.3ML ~~LOC~~ SOLN: 30.0000 mg | SUBCUTANEOUS | Status: DC

## 2015-12-18 MED ORDER — SODIUM CHLORIDE 0.9 % IV SOLN
INTRAVENOUS | Status: AC
  Administered 2015-12-18: 14:00:00 via INTRAVENOUS

## 2015-12-18 MED ORDER — ATORVASTATIN CALCIUM 40 MG PO TABS
40.0000 mg | ORAL_TABLET | Freq: Every day | ORAL | Status: DC
  Administered 2015-12-18: 40 mg via ORAL
  Filled 2015-12-18: qty 1

## 2015-12-18 MED ORDER — CLOPIDOGREL BISULFATE 75 MG PO TABS
75.0000 mg | ORAL_TABLET | Freq: Every day | ORAL | Status: DC
  Administered 2015-12-19: 75 mg via ORAL
  Filled 2015-12-18: qty 1

## 2015-12-18 MED ORDER — LEVOTHYROXINE SODIUM 75 MCG PO TABS
150.0000 ug | ORAL_TABLET | Freq: Every day | ORAL | Status: DC
Start: 2015-12-19 — End: 2015-12-19
  Administered 2015-12-19: 150 ug via ORAL
  Filled 2015-12-18: qty 2

## 2015-12-18 MED ORDER — STROKE: EARLY STAGES OF RECOVERY BOOK
Freq: Once | Status: DC
  Filled 2015-12-18: qty 1

## 2015-12-18 MED ORDER — SENNOSIDES-DOCUSATE SODIUM 8.6-50 MG PO TABS: 1.0000 | ORAL_TABLET | Freq: Every evening | ORAL | Status: DC | PRN

## 2015-12-18 MED ORDER — ASPIRIN EC 325 MG PO TBEC
325.0000 mg | DELAYED_RELEASE_TABLET | Freq: Once | ORAL | Status: AC
  Administered 2015-12-18: 325 mg via ORAL
  Filled 2015-12-18: qty 1

## 2015-12-18 NOTE — ED Notes (Signed)
Patient transported to CT 

## 2015-12-18 NOTE — Progress Notes (Signed)
Pt back to floor.

## 2015-12-18 NOTE — ED Provider Notes (Signed)
[place] DEPT [facility] Provider Note   CSN: [id number] Arrival date & time: 12/18/15  [id number]     History   Chief Complaint Chief Complaint  Patient presents with  . Numbness    left arm    HPI Damon Foley is a 75 y.o. male.  Pt presents to the ED this morning with left arm weakness and numbness.  The pt said that it started about 1 hr ago when he woke up.  He said he tried to turn off the alarm and his arm was not acting right.  Pt said that he could not grasp things well and could not control his hand when he tried to put his wallet into his pocket.  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  He said that has improved.  He had a carotid US on 9/1 which showed minimal bilateral plaque.  He is scheduled for a MRI this morning and an ECHO on 9/20.  The pt said his left arm is improving.  He did take his 81 mg ASA this am.      Past Medical History:  Diagnosis Date  . BCC (basal cell carcinoma) 2016   L preauricular/sideburn  . Benign prostatic hypertrophy   . Carotid bruit 2002   per chart review, right, neg u/s  . Diverticulitis   . ETOH abuse 1991   quit  . GERD (gastroesophageal reflux disease)   . Hyperlipidemia   . Hypertension   . 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 Hypothyroidism   . Renal calculus   . Tonsillar tag 2016   Dr. Redmond Baseman, ENT    Patient Active Problem List   Diagnosis Date Noted  . CVA (cerebral infarction) 12/18/2015  . Visual disturbance 12/09/2015  . Bilateral shoulder pain 01/22/2015  . Follow-up--------------PCP notes 12/18/2014  . Cerumen impaction 11/11/2013  . Annual physical exam 02/20/2011  . Hypertension 06/11/2008  . HYPERGLYCEMIA, BORDERLINE 06/11/2008  . Hyperparathyroidism ([place]) 12/13/2007  . Hypothyroidism 06/24/2007  . Hyperlipidemia 09/25/2006  . GERD 09/25/2006  . BPH (benign prostatic  hyperplasia) 09/25/2006  . DIVERTICULITIS, HX OF 09/25/2006  . RENAL CALCULUS, HX OF 09/25/2006    Past Surgical History:  Procedure Laterality Date  . CHOLECYSTECTOMY    . dental implants     04-2015  . HYDROCELE EXCISION / REPAIR  1987   LEFT  . KIDNEY STONE SURGERY  2006   right kidney stone removed, had stent, did not work,       Home Medications    Prior to Admission medications   Medication Sig Start Date End Date Taking? Authorizing Provider  aspirin EC 81 MG tablet Take 81 mg by mouth daily.    Historical Provider, MD  atenolol (TENORMIN) 50 MG tablet Take 1 tablet (50 mg total) by mouth daily. 01/18/15   Colon Branch, MD  atorvastatin (LIPITOR) 40 MG tablet Take 1 tablet (40 mg total) by mouth at bedtime. 12/15/15   Colon Branch, MD  diclofenac sodium (VOLTAREN) 1 % GEL Apply 2 g topically 4 (four) times daily. 06/25/15   Colon Branch, MD  levothyroxine (SYNTHROID, LEVOTHROID) 150 MCG tablet Take 1 tablet (150 mcg total) by mouth daily before breakfast. 12/15/15   Colon Branch, MD  lisinopril (PRINIVIL,ZESTRIL) 40 MG tablet Take 1 tablet (40 mg total) by mouth daily.  01/18/15   Colon Branch, MD  meloxicam (MOBIC) 7.5 MG tablet Take 1-2 tablets (7.5-15 mg total) by mouth daily as needed for pain. 06/25/15   Colon Branch, MD  silodosin (RAPAFLO) 8 MG CAPS capsule Take 1 capsule (8 mg total) by mouth daily with breakfast. 01/18/15   Colon Branch, MD    Family History Family History  Problem Relation Age of Onset  . Heart attack Mother 17  . Prostate cancer Father 92  . Cancer Brother     type?  . Diabetes Neg Hx   . Colon cancer Neg Hx     Social History Social History  Substance Use Topics  . Smoking status: Former Smoker    Quit date: 04/10/1989  . Smokeless tobacco: Never Used  . Alcohol use No     Comment: quit - 04/1989     Allergies   Review of patient's allergies indicates no known allergies.   Review of Systems Review of Systems  Neurological: Positive for weakness  and numbness.  All other systems reviewed and are negative.    Physical Exam Updated Vital Signs BP 138/67   Pulse (!) 55   Temp 98.2 F (36.8 C) (Oral)   Resp 21   Ht 5\' 11"  (1.803 m)   Wt 220 lb (99.8 kg)   SpO2 99%   BMI 30.68 kg/m   Physical Exam  Constitutional: He is oriented to person, place, and time. He appears well-developed and well-nourished.  HENT:  Head: Normocephalic and atraumatic.  Right Ear: External ear normal.  Left Ear: External ear normal.  Nose: Nose normal.  Mouth/Throat: Oropharynx is clear and moist.  Eyes: Conjunctivae and EOM are normal. Pupils are equal, round, and reactive to light.  Neck: Normal range of motion. Neck supple.  Cardiovascular: Normal rate, regular rhythm, normal heart sounds and intact distal pulses.   Pulmonary/Chest: Effort normal and breath sounds normal.  Abdominal: Soft. Bowel sounds are normal.  Musculoskeletal: Normal range of motion.  Neurological: He is alert and oriented to person, place, and time.  Skin: Skin is warm and dry.  Psychiatric: He has a normal mood and affect. His behavior is normal. Judgment and thought content normal.  Nursing note and vitals reviewed.    ED Treatments / Results  Labs (all labs ordered are listed, but only abnormal results are displayed) Labs Reviewed  CBC - Abnormal; Notable for the following:       Result Value   Platelets 126 (*)    All other components within normal limits  COMPREHENSIVE METABOLIC PANEL - Abnormal; Notable for the following:    Glucose, Bld 125 (*)    BUN 23 (*)    Creatinine, Ser 1.26 (*)    Alkaline Phosphatase 35 (*)    Total Bilirubin 1.4 (*)    GFR calc non Af Amer 54 (*)    All other components within normal limits  URINE RAPID DRUG SCREEN, HOSP PERFORMED  TROPONIN I  URINALYSIS, ROUTINE W REFLEX MICROSCOPIC (NOT AT [facility])    EKG  EKG Interpretation  Date/Time:  Saturday December 18 2015 07:39:19 EDT Ventricular Rate:  57 PR Interval:      QRS Duration: 106 QT Interval:  427 QTC Calculation: 416 R Axis:   -54 Text Interpretation:  Sinus rhythm Borderline prolonged PR interval LAD, consider left anterior fascicular block Anterior infarct, old Confirmed by [facility] MD, Aaminah Forrester [id number]) on 12/18/2015 7:44:16 AM Also confirmed by Gilford Raid MD, Robyn Nohr [id number]), editor Lorenda Cahill CT,  Leda Gauze [phone])  on 12/18/2015 8:41:34 AM       Radiology Ct Head Wo Contrast  Result Date: 12/18/2015 CLINICAL DATA:  Patient with episode of left arm/hand numbness and tingling starting at 6:15 morning. Also has history of blurred vision in right eye. EXAM: CT HEAD WITHOUT CONTRAST TECHNIQUE: Contiguous axial images were obtained from the base of the skull through the vertex without intravenous contrast. COMPARISON:  None. FINDINGS: Brain: No acute territorial infarction or intracranial hemorrhage. No focal mass lesion identified. No mass effect or midline shift. There is a small focal hypodensity in the posterior limb of right external capsule, series 2, image number 17. Gray-white matter differentiation is otherwise preserved. There are scattered right cortical calcifications. Vascular: There are atherosclerotic calcifications visualized within the vertebrobasilar system and within the intracranial internal carotid arteries at the skullbase. Left vertebral artery appears dominant. Skull: No fractures identified. Mastoid air cells are clear. No lytic or sclerotic lesions. Sinuses/Orbits: Paranasal sinuses are clear.  Globes appear intact. Other: None IMPRESSION: 1. Small focal hypodensity in the region of the posterior limb of right external capsule could relate to age-indeterminate lacune. Of note, patient is scheduled for MRI of the brain ; this area could be evaluated at the time of MRI. 2. Otherwise no acute intracranial abnormality. 3. Atherosclerotic vascular disease. Electronically Signed   By: Donavan Foil M.D.   On: 12/18/2015 08:25   Mr Brain Wo Contrast  Result  Date: 12/18/2015 CLINICAL DATA:  New onset left arm numbness beginning 2 hours ago. EXAM: MRI HEAD WITHOUT CONTRAST TECHNIQUE: Multiplanar, multiecho pulse sequences of the brain and surrounding structures were obtained without intravenous contrast. COMPARISON:  None. FINDINGS: Brain: An acute nonhemorrhagic infarct is present in the posterior right frontal lobe along the inferior surface of the right frontal operculum. There is no evidence for acute ischemia within the posterior right basal ganglia or internal capsule. The cortical infarct is not visible on the prior CT scan. Subtle T2 changes are associated with the area of acute infarct. There is a remote lacunar infarct along the posterior aspect of the right lentiform nucleus and external capsule which correlates with the finding at CT. No other significant white matter disease is present. The ventricles are of normal size. No significant extraaxial fluid collection is present. The internal auditory canals are within normal limits bilaterally. The brainstem and cerebellum are normal. Vascular: Flow is present in the major intracranial arteries. Skull and upper cervical spine: The skullbase is within normal limits. Intracranial midline structures are within normal limits. Degenerative changes are present in the upper cervical spine. The upper cervical cord is within normal limits Sinuses/Orbits: The paranasal sinuses and mastoid air cells are clear Other: IMPRESSION: 1. Acute nonhemorrhagic infarct within the posterior right frontal lobe along the inferior surface of the right frontal operculum. 2. Remote lacunar infarct in the posterior right lentiform nucleus and external capsule corresponds with the finding at CT. 3. No other acute intracranial abnormality. These results were called by telephone at the time of interpretation on 12/18/2015 at 9:15 am to Dr. Isla Pence , who verbally acknowledged these results. Electronically Signed   By: San Morelle  M.D.   On: 12/18/2015 09:26    Procedures Procedures (including critical care time)  Medications Ordered in ED Medications  aspirin EC tablet 325 mg (325 mg Oral Given 12/18/15 0943)     Initial Impression / Assessment and Plan / ED Course  I have reviewed the triage vital signs and the  nursing notes.  Pertinent labs & imaging results that were available during my care of the patient were reviewed by me and considered in my medical decision making (see chart for details).  Clinical Course   Results of MRI d/w patient.  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    I spoke with Dr. Leonel Ramsay (neurology) who recommended full strength ASA and admission to the hospitalist service.  Pt d/w Dr. Daleen Bo (triad) who will admit.   Final Clinical Impressions(s) / ED Diagnoses   Final diagnoses:  Acute CVA (cerebrovascular accident) [facility])    New Prescriptions New Prescriptions   No medications on file     Isla Pence, MD 12/18/15 [phone]

## 2015-12-18 NOTE — Progress Notes (Signed)
Pt down to radiology.

## 2015-12-18 NOTE — Progress Notes (Signed)
Pt arrived to unit. Oriented to room. Telemetry started. Call bell within reach.

## 2015-12-18 NOTE — Consult Note (Signed)
Referring Physician: Dr Daleen Bo    Chief Complaint: Left arm numbness  HPI: Damon Foley is an 75 y.o. male with a history of hypothyroidism, hypertension, hyperlipidemia, alcohol abuse, and a carotid bruit who was admitted to [facility] today, 12/18/2015, for evaluation of left arm numbness. MRI today - acute nonhemorrhagic infarct within the posterior right frontal lobe along the inferior surface of the right frontal operculum with a remote lacunar infarct in the posterior right lentiform nucleus and external capsule. The patient states that he was not aware of any previous strokes. He takes aspirin 81 mg every day without fail.  Approximately 10-12 days ago the patient developed a visual disturbance in his right eye. He describes a horizontal line of blurriness, like looking through rain drops, with clear vision above and below this area. He saw his primary care physician who sent him to an ophthalmologist. He was scheduled for an MRI today for further evaluation. When he awoke at 6 AM he noticed that his left arm was numb. This caused some difficulty with coordination and control. The arm symptoms improved over the next several hours and he is now back to baseline. His vision has improved as well; however, he still has some mild blurriness. He was already scheduled for the MRI today so he came to [facility] for further evaluation.  Date last known well: Unable to determine Time last known well: Unable to determine tPA Given: No: The patient was out of the therapeutic time window for treatment with TPA.  Past Medical History:  Diagnosis Date  . BCC (basal cell carcinoma) 2016   L preauricular/sideburn  . Benign prostatic hypertrophy   . Carotid bruit 2002   per chart review, right, neg u/s  . Diverticulitis   . ETOH abuse 1991   quit  . GERD (gastroesophageal reflux disease)   . Hyperlipidemia   . Hypertension   . 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 Hypothyroidism    . Renal calculus   . Tonsillar tag 2016   Dr. Redmond Baseman, ENT    Past Surgical History:  Procedure Laterality Date  . CHOLECYSTECTOMY    . dental implants     04-2015  . HYDROCELE EXCISION / REPAIR  1987   LEFT  . KIDNEY STONE SURGERY  2006   right kidney stone removed, had stent, did not work,    Family History  Problem Relation Age of Onset  . Heart attack Mother 48  . Prostate cancer Father 10  . Cancer Brother     type?  . Diabetes Neg Hx   . Colon cancer Neg Hx    Social History:  reports that he quit smoking about 26 years ago. He has never used smokeless tobacco. He reports that he does not drink alcohol or use drugs.  Allergies: No Known Allergies  Medications:  Scheduled: . atorvastatin  40 mg Oral QHS  . [START ON 12/19/2015] clopidogrel  75 mg Oral Daily  . [START ON 12/19/2015] enoxaparin (LOVENOX) injection  30 mg Subcutaneous Q24H  . [START ON 12/19/2015] levothyroxine  150 mcg Oral QAC breakfast  . tamsulosin  0.4 mg Oral QPC supper    ROS: History obtained from the patient - Negative review of systems except as noted in the history of present illness.  General ROS: negative for - chills, fatigue, fever, night sweats, weight gain or weight loss Psychological ROS: negative for - behavioral disorder, hallucinations, memory difficulties, mood swings or suicidal ideation Ophthalmic ROS: negative for -  blurry vision, double vision, eye pain or loss of vision ENT ROS: negative for - epistaxis, nasal discharge, oral lesions, sore throat, tinnitus or vertigo Allergy and Immunology ROS: negative for - hives or itchy/watery eyes Hematological and Lymphatic ROS: negative for - bleeding problems, bruising or swollen lymph nodes Endocrine ROS: negative for - galactorrhea, hair pattern changes, polydipsia/polyuria or temperature intolerance Respiratory ROS: negative for - cough, hemoptysis, shortness of breath or wheezing Cardiovascular ROS: negative for - chest pain,  dyspnea on exertion, edema or irregular heartbeat Gastrointestinal ROS: negative for - abdominal pain, diarrhea, hematemesis, nausea/vomiting or stool incontinence Genito-Urinary ROS: negative for - dysuria, hematuria, incontinence or urinary frequency/urgency Musculoskeletal ROS: negative for - joint swelling or muscular weakness Neurological ROS: as noted in HPI Dermatological ROS: negative for rash and skin lesion changes   Physical Examination: Blood pressure (!) 150/66, pulse (!) 56, temperature 98.1 F (36.7 C), temperature source Oral, resp. rate 18, height 5\' 11"  (1.803 m), weight 99.8 kg (220 lb), SpO2 98 %.  General - very pleasant, alert, talkative, 75 year old male in no acute distress. Heart - Regular rate and rhythm - no murmer appreciated Lungs - Clear to auscultation anteriorly Abdomen - Soft - non tender Extremities - Distal pulses intact - no edema Skin - Warm and dry  Mental Status: Alert, oriented, thought content appropriate.  Speech fluent without evidence of aphasia.  Able to follow 3 step commands without difficulty. Cranial Nerves: II: Discs not visualized; Visual fields grossly normal, pupils equal, round, reactive to light and accommodation III,IV, VI: ptosis not present, extra-ocular motions intact bilaterally V,VII: smile symmetric, facial light touch sensation normal bilaterally VIII: hearing normal bilaterally IX,X: gag reflex present XI: bilateral shoulder shrug XII: midline tongue extension Motor: Right : Upper extremity   5/5    Left:     Upper extremity   5/5  Lower extremity   5/5     Lower extremity   5/5 Tone and bulk:normal tone throughout; no atrophy noted Sensory: Light touch intact throughout, bilaterally Deep Tendon Reflexes: 2+ and symmetric throughout Plantars: Right: downgoing   Left: downgoing Cerebellar: normal finger-to-nose Gait: Deferred at this time CV: pulses palpable throughout   Laboratory Studies:  Basic Metabolic  Panel:  Recent Labs Lab 12/14/15 1508 12/18/15 0745  NA 137 135  K 4.3 3.9  CL 104 105  CO2 27 24  GLUCOSE 107* 125*  BUN 18 23*  CREATININE 1.27 1.26*  CALCIUM 9.4 9.8    Liver Function Tests:  Recent Labs Lab 12/14/15 1508 12/18/15 0745  AST 21 26  ALT 31 33  ALKPHOS 36* 35*  BILITOT 1.3* 1.4*  PROT 6.9 6.8  ALBUMIN 4.5 4.3   No results for input(s): LIPASE, AMYLASE in the last 168 hours. No results for input(s): AMMONIA in the last 168 hours.  CBC:  Recent Labs Lab 12/14/15 1508 12/18/15 0745  WBC 4.9 5.1  NEUTROABS 3.2  --   HGB 16.1 15.3  HCT 45.8 42.5  MCV 93.3 89.7  PLT 160.0 126*    Cardiac Enzymes:  Recent Labs Lab 12/18/15 0745  TROPONINI <0.03    BNP: Invalid input(s): POCBNP  CBG: No results for input(s): GLUCAP in the last 168 hours.  Microbiology: No results found for this or any previous visit.  Coagulation Studies: No results for input(s): LABPROT, INR in the last 72 hours.  Urinalysis:  Recent Labs Lab 12/18/15 0851  COLORURINE YELLOW  LABSPEC 1.019  PHURINE 5.0  GLUCOSEU NEGATIVE  HGBUR  NEGATIVE  BILIRUBINUR NEGATIVE  KETONESUR NEGATIVE  PROTEINUR NEGATIVE  NITRITE NEGATIVE  LEUKOCYTESUR NEGATIVE    Lipid Panel:    Component Value Date/Time   CHOL 160 12/14/2015 1508   TRIG 172.0 (H) 12/14/2015 1508   HDL 40.50 12/14/2015 1508   CHOLHDL 4 12/14/2015 1508   VLDL 34.4 12/14/2015 1508   LDLCALC 85 12/14/2015 1508    HgbA1C:  Lab Results  Component Value Date   HGBA1C 5.4 12/14/2015    Urine Drug Screen:     Component Value Date/Time   LABOPIA NONE DETECTED 12/18/2015 0851   COCAINSCRNUR NONE DETECTED 12/18/2015 0851   LABBENZ NONE DETECTED 12/18/2015 0851   AMPHETMU NONE DETECTED 12/18/2015 0851   THCU NONE DETECTED 12/18/2015 0851   LABBARB NONE DETECTED 12/18/2015 0851    Alcohol Level: No results for input(s): ETH in the last 168 hours.  Other results: EKG: Sinus rhythm rate 57 bpm. Old  anterior infarct. Please refer to the formal cardiology interpretation.  Imaging:  Ct Head Wo Contrast 12/18/2015 1. Small focal hypodensity in the region of the posterior limb of right external capsule could relate to age-indeterminate lacune.  2. Otherwise no acute intracranial abnormality.  3. Atherosclerotic vascular disease.    Mr Brain Wo Contrast 12/18/2015 1. Acute nonhemorrhagic infarct within the posterior right frontal lobe along the inferior surface of the right frontal operculum.  2. Remote lacunar infarct in the posterior right lentiform nucleus and external capsule corresponds with the finding at CT.  3. No other acute intracranial abnormality.    MRA - pending  CUS 12/09/2015 Minimal amount of bilateral atherosclerotic plaque, right greater than left, not resulting in a hemodynamically significant stenosis within either internal carotid artery.    Assessment: 75 y.o. male with a history of hypothyroidism, hypertension, renal insufficiency, abnormal EKG, and hyperlipidemia, developed visual changes in the right eye approximately 10-12 days ago and was scheduled for a stroke workup. Today he awoke with left arm numbness which gradually improved throughout the day. He visual changes have also improved but not resolved. MRI as noted above shows new acute infarct within the posterior right frontal lobe as well as a remote lacunar infarct in the posterior right lentiform nucleus and external capsule. The patient was not aware of any previous infarcts. He was taking aspirin 81 mg daily prior to admission but is now on Plavix 75 mg daily.  Stroke Risk Factors - hyperlipidemia and hypertension  Plan: 1. HgbA1c, fasting lipid panel 2. MRI, MRA  of the brain without contrast 3. PT consult, OT consult, Speech consult 4. Echocardiogram 5. Carotid dopplers 6. Prophylactic therapy- Plavix 75 mg daily 7. NPO until RN stroke swallow screen 8. Telemetry monitoring 9. Frequent neuro  checks   The patient is scheduled for an MRA this afternoon. Dr. Cecil Cobbs note to follow.  Mikey Bussing PA-C [facility] Pager [phone] 12/18/2015, 3:09 PM   I have seen the patient and reviewed the note above. 75 year old male with multiple embolic phenomena over the past week, he will need to be admitted for further workup of likely embolic strokes.  Roland Rack, MD [facility] [phone]  If 7pm- 7am, please page neurology on call as listed in [place].

## 2015-12-18 NOTE — H&P (Signed)
[facility] History and Physical  Damon Foley [id number] DOB: [birth year] DOA: 12/18/2015  Referring physician:  PCP: [name], MD  Specialists:   Chief Complaint: left arm weakness   HPI: Damon Foley is a 75 y.o. male with PMH of HTN, Hypothyroidism, presented with left arm weakness and numbness. Patient had right eye blurry vision two weeks ago. He was seen by PCP and ophthalmologist as outpatient. He was having outpatient work up for stroke with brain mri, carotid ultrasound.  He had carotid ultrasound, but could not follow up with mri.  He woke up today with sudden onset of left arm weakness and numbness and presented to ED. He could not grasp things well and could not control his right hand when he tried to put his wallet into his pocket. Symptoms have resolved prior admission. He denies changes in speech, no dysphasia. No other focal neuro symptoms. Denies fever, chills, no headaches, no cough, no chest pains -ED: MRI brain showed right cortical stroke. Referred for admission   Review of Systems: The patient denies anorexia, fever, weight loss,,, decreased hearing, hoarseness, chest pain, syncope, dyspnea on exertion, peripheral edema, balance deficits, hemoptysis, abdominal pain, melena, hematochezia, severe indigestion/heartburn, hematuria, incontinence, genital sores, muscle weakness, suspicious skin lesions, transient blindness, difficulty walking, depression, unusual weight change, abnormal bleeding, enlarged lymph nodes, angioedema, and breast masses.    Past Medical History:  Diagnosis Date  . BCC (basal cell carcinoma) 2016   L preauricular/sideburn  . Benign prostatic hypertrophy   . Carotid bruit 2002   per chart review, right, neg u/s  . Diverticulitis   . ETOH abuse 1991   quit  . GERD (gastroesophageal reflux disease)   . Hyperlipidemia   . Hypertension   . 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 Hypothyroidism   . Renal calculus   . Tonsillar  tag 2016   Dr. Redmond Baseman, ENT   Past Surgical History:  Procedure Laterality Date  . CHOLECYSTECTOMY    . dental implants     04-2015  . HYDROCELE EXCISION / REPAIR  1987   LEFT  . KIDNEY STONE SURGERY  2006   right kidney stone removed, had stent, did not work,   Social History:  reports that he quit smoking about 26 years ago. He has never used smokeless tobacco. He reports that he does not drink alcohol or use drugs. Home;  where does patient live--home, ALF, SNF? and with whom if at home? Yes;  Can patient participate in ADLs?  No Known Allergies  Family History  Problem Relation Age of Onset  . Heart attack Mother 70  . Prostate cancer Father 65  . Cancer Brother     type?  . Diabetes Neg Hx   . Colon cancer Neg Hx     (be sure to complete)  Prior to Admission medications   Medication Sig Start Date End Date Taking? Authorizing Provider  aspirin EC 81 MG tablet Take 81 mg by mouth daily.    Historical Provider, MD  atenolol (TENORMIN) 50 MG tablet Take 1 tablet (50 mg total) by mouth daily. 01/18/15   Colon Branch, MD  atorvastatin (LIPITOR) 40 MG tablet Take 1 tablet (40 mg total) by mouth at bedtime. 12/15/15   Colon Branch, MD  diclofenac sodium (VOLTAREN) 1 % GEL Apply 2 g topically 4 (four) times daily. 06/25/15   Colon Branch, MD  levothyroxine (SYNTHROID, LEVOTHROID) 150 MCG tablet Take 1 tablet (150 mcg total) by mouth daily before  breakfast. 12/15/15   Colon Branch, MD  lisinopril (PRINIVIL,ZESTRIL) 40 MG tablet Take 1 tablet (40 mg total) by mouth daily. 01/18/15   Colon Branch, MD  meloxicam (MOBIC) 7.5 MG tablet Take 1-2 tablets (7.5-15 mg total) by mouth daily as needed for pain. 06/25/15   Colon Branch, MD  silodosin (RAPAFLO) 8 MG CAPS capsule Take 1 capsule (8 mg total) by mouth daily with breakfast. 01/18/15   Colon Branch, MD   Physical Exam: Vitals:   12/18/15 1130 12/18/15 1227  BP: 142/66 (!) 150/66  Pulse: (!) 55 (!) 56  Resp: 19 18  Temp:  98.1 F (36.7 C)      General:  Alert, no distress   Eyes: eom-I, perrla   ENT: no oral ulcers   Neck: supple, no JVD  Cardiovascular: s1,s2 rrr  Respiratory: CTA BL  Abdomen: soft, nt, nd   Skin: no rash   Musculoskeletal: no leg edema   Psychiatric: no hallucinations   Neurologic: CN 2-12 intact. Motor 5/5 BL symmetric, sensation preserved   Labs on Admission:  Basic Metabolic Panel:  Recent Labs Lab 12/14/15 1508 12/18/15 0745  NA 137 135  K 4.3 3.9  CL 104 105  CO2 27 24  GLUCOSE 107* 125*  BUN 18 23*  CREATININE 1.27 1.26*  CALCIUM 9.4 9.8   Liver Function Tests:  Recent Labs Lab 12/14/15 1508 12/18/15 0745  AST 21 26  ALT 31 33  ALKPHOS 36* 35*  BILITOT 1.3* 1.4*  PROT 6.9 6.8  ALBUMIN 4.5 4.3   No results for input(s): LIPASE, AMYLASE in the last 168 hours. No results for input(s): AMMONIA in the last 168 hours. CBC:  Recent Labs Lab 12/14/15 1508 12/18/15 0745  WBC 4.9 5.1  NEUTROABS 3.2  --   HGB 16.1 15.3  HCT 45.8 42.5  MCV 93.3 89.7  PLT 160.0 126*   Cardiac Enzymes:  Recent Labs Lab 12/18/15 0745  TROPONINI <0.03    BNP (last 3 results) No results for input(s): BNP in the last 8760 hours.  ProBNP (last 3 results) No results for input(s): PROBNP in the last 8760 hours.  CBG: No results for input(s): GLUCAP in the last 168 hours.  Radiological Exams on Admission: Ct Head Wo Contrast  Result Date: 12/18/2015 CLINICAL DATA:  Patient with episode of left arm/hand numbness and tingling starting at 6:15 morning. Also has history of blurred vision in right eye. EXAM: CT HEAD WITHOUT CONTRAST TECHNIQUE: Contiguous axial images were obtained from the base of the skull through the vertex without intravenous contrast. COMPARISON:  None. FINDINGS: Brain: No acute territorial infarction or intracranial hemorrhage. No focal mass lesion identified. No mass effect or midline shift. There is a small focal hypodensity in the posterior limb of right  external capsule, series 2, image number 17. Gray-white matter differentiation is otherwise preserved. There are scattered right cortical calcifications. Vascular: There are atherosclerotic calcifications visualized within the vertebrobasilar system and within the intracranial internal carotid arteries at the skullbase. Left vertebral artery appears dominant. Skull: No fractures identified. Mastoid air cells are clear. No lytic or sclerotic lesions. Sinuses/Orbits: Paranasal sinuses are clear.  Globes appear intact. Other: None IMPRESSION: 1. Small focal hypodensity in the region of the posterior limb of right external capsule could relate to age-indeterminate lacune. Of note, patient is scheduled for MRI of the brain ; this area could be evaluated at the time of MRI. 2. Otherwise no acute intracranial abnormality. 3. Atherosclerotic vascular  disease. Electronically Signed   By: Donavan Foil M.D.   On: 12/18/2015 08:25   Mr Brain Wo Contrast  Result Date: 12/18/2015 CLINICAL DATA:  New onset left arm numbness beginning 2 hours ago. EXAM: MRI HEAD WITHOUT CONTRAST TECHNIQUE: Multiplanar, multiecho pulse sequences of the brain and surrounding structures were obtained without intravenous contrast. COMPARISON:  None. FINDINGS: Brain: An acute nonhemorrhagic infarct is present in the posterior right frontal lobe along the inferior surface of the right frontal operculum. There is no evidence for acute ischemia within the posterior right basal ganglia or internal capsule. The cortical infarct is not visible on the prior CT scan. Subtle T2 changes are associated with the area of acute infarct. There is a remote lacunar infarct along the posterior aspect of the right lentiform nucleus and external capsule which correlates with the finding at CT. No other significant white matter disease is present. The ventricles are of normal size. No significant extraaxial fluid collection is present. The internal auditory canals are  within normal limits bilaterally. The brainstem and cerebellum are normal. Vascular: Flow is present in the major intracranial arteries. Skull and upper cervical spine: The skullbase is within normal limits. Intracranial midline structures are within normal limits. Degenerative changes are present in the upper cervical spine. The upper cervical cord is within normal limits Sinuses/Orbits: The paranasal sinuses and mastoid air cells are clear Other: IMPRESSION: 1. Acute nonhemorrhagic infarct within the posterior right frontal lobe along the inferior surface of the right frontal operculum. 2. Remote lacunar infarct in the posterior right lentiform nucleus and external capsule corresponds with the finding at CT. 3. No other acute intracranial abnormality. These results were called by telephone at the time of interpretation on 12/18/2015 at 9:15 am to Dr. Isla Pence , who verbally acknowledged these results. Electronically Signed   By: San Morelle M.D.   On: 12/18/2015 09:26    EKG: Independently reviewed.   Assessment/Plan Active Problems:   HTN (hypertension)   GERD   CVA (cerebral infarction)   Acute CVA (cerebrovascular accident) ([place])  75 y.o. male with PMH of HTN, CKD, Hypothyroidism, presented with left arm weakness and numbness. admitted with acute stroke    Acute CVA. Left sided weakness, paresthesia prior to admission, resolved. MRI brain: Acute nonhemorrhagic infarct within the posterior right frontal lobe along the inferior surface of the right frontal operculum  -we will obtain further stroke work up with echo, ha1c, lipid profile, monitor on tele. Outpatient carotid ultrasound was unremarkable.  -patient was taking aspirin at home. Will change to plavix, cont statin  HTN. Allow mild permissive hypertension today. Hold atenolol due to bradycardia, hold lisinopril  -resume bp meds tomorrow as needed Hypothyroidism, cont levothyroxine. TSH-4.8   Neurology. if consultant  consulted, please document name and whether formally or informally consulted  Code Status: full (must indicate code status--if unknown or must be presumed, indicate so) Family Communication: d/w patient, rn, neurology (indicate person spoken with, if applicable, with phone number if by telephone) Disposition Plan: home pend stroke work up (indicate anticipated LOS)  Time spent: >45 minutes   Kinnie Feil [facility] Pager [phone]  If 7PM-7AM, please contact night-coverage [email or web address] Password [email or web address] 12/18/2015, 1:02 PM

## 2015-12-18 NOTE — ED Notes (Signed)
MD at bedside. 

## 2015-12-18 NOTE — ED Notes (Signed)
Pt on automatic vital signs.

## 2015-12-18 NOTE — ED Triage Notes (Addendum)
Pt c/o left arm numbness and decreased ability to grasp and grip items for the the last hour, pt denies any other symptoms.  Pt states he recently had a "mini stroke behind my right eye."

## 2015-12-18 NOTE — ED Notes (Signed)
Pt given urinal in order to attempt to void.

## 2015-12-19 ENCOUNTER — Inpatient Hospital Stay (HOSPITAL_COMMUNITY): Payer: Medicare Other

## 2015-12-19 ENCOUNTER — Encounter (HOSPITAL_COMMUNITY): Payer: Self-pay | Admitting: *Deleted

## 2015-12-19 DIAGNOSIS — I639 Cerebral infarction, unspecified: Principal | ICD-10-CM

## 2015-12-19 DIAGNOSIS — I10 Essential (primary) hypertension: Secondary | ICD-10-CM

## 2015-12-19 DIAGNOSIS — I6521 Occlusion and stenosis of right carotid artery: Secondary | ICD-10-CM

## 2015-12-19 DIAGNOSIS — I63131 Cerebral infarction due to embolism of right carotid artery: Secondary | ICD-10-CM

## 2015-12-19 LAB — ECHOCARDIOGRAM COMPLETE
AO mean calculated velocity dopler: 125 cm/s
AOASC: 32 cm
AV Mean grad: 8 mmHg
AVPG: 14 mmHg
AVPHT: 740 ms
AVPKVEL: 190 cm/s
CHL CUP MV DEC (S): 387
CHL CUP STROKE VOLUME: 56 mL
E decel time: 387 msec
E/e' ratio: 8.06
FS: 43 % (ref 28–44)
Height: 71 in
IV/PV OW: 1.04
LA diam index: 1.37 cm/m2
LA vol: 29.5 mL
LASIZE: 31 mm
LAVOLA4C: 28 mL
LAVOLIN: 13 mL/m2
LEFT ATRIUM END SYS DIAM: 31 mm
LV E/e' medial: 8.06
LV PW d: 8.63 mm — AB (ref 0.6–1.1)
LV SIMPSON'S DISK: 71
LV TDI E'MEDIAL: 5.22
LV dias vol index: 35 mL/m2
LV dias vol: 78 mL (ref 62–150)
LV e' LATERAL: 7.94 cm/s
LV sys vol index: 10 mL/m2
LV sys vol: 23 mL (ref 21–61)
LVEEAVG: 8.06
LVOT area: 3.14 cm2
LVOT diameter: 20 mm
Lateral S' vel: 14.3 cm/s
MVPKAVEL: 104 m/s
MVPKEVEL: 64 m/s
Reg peak vel: 256 cm/s
TAPSE: 29 mm
TDI e' lateral: 7.94
TRMAXVEL: 256 cm/s
VTI: 43.8 cm
Weight: 3520 oz

## 2015-12-19 LAB — LIPID PANEL
CHOLESTEROL: 163 mg/dL (ref 0–200)
HDL: 35 mg/dL — ABNORMAL LOW (ref >40)
LDL CALC: 85 mg/dL (ref 0–99)
TRIGLYCERIDES: 213 mg/dL — AB (ref <150)
Total CHOL/HDL Ratio: 4.7 RATIO
VLDL: 43 mg/dL — ABNORMAL HIGH (ref 0–40)

## 2015-12-19 MED ORDER — ENOXAPARIN SODIUM 40 MG/0.4ML ~~LOC~~ SOLN: 40.0000 mg | SUBCUTANEOUS | Status: DC

## 2015-12-19 MED ORDER — IOPAMIDOL (ISOVUE-370) INJECTION 76%
INTRAVENOUS | Status: AC
  Administered 2015-12-19: 50 mL
  Filled 2015-12-19: qty 50

## 2015-12-19 MED ORDER — CLOPIDOGREL BISULFATE 75 MG PO TABS: 75.0000 mg | ORAL_TABLET | Freq: Every day | ORAL | 0 refills | Status: DC

## 2015-12-19 NOTE — Discharge Instructions (Signed)
Ischemic Stroke Treated With Warfarin An ischemic stroke (cerebrovascular accident) is the sudden death of brain tissue. It is a medical emergency. An ischemic stroke can cause permanent loss of brain function. This can cause problems with different parts of your body. CAUSES An ischemic stroke is caused by a decrease of oxygen supply to an area of your brain. It is usually the result of a small blood clot (embolus) or collection of cholesterol or fat (plaque) that blocks blood flow in the brain. An ischemic stroke can also be caused by blocked or damaged carotid arteries. RISK FACTORS  High blood pressure (hypertension).  High cholesterol.  Diabetes mellitus.  Heart disease.  The buildup of plaque in the blood vessels (peripheral artery disease or atherosclerosis).  The buildup of plaque in the blood vessels that provide blood and oxygen to the brain (carotid artery stenosis).  An abnormal heart rhythm (atrial fibrillation).  Obesity.  Smoking cigarettes.  Taking oral contraceptives, especially in combination with using tobacco.  Physical inactivity.  A diet that is high in fats, salt (sodium), and calories.  Excessive alcohol use.  Use of illegal drugs, especially cocaine and methamphetamine.  Being African American.  Being over the age of 55 years.  Family history of stroke.  Previous history of blood clots, stroke, TIA (transient ischemic attack), or heart attack.  Sickle cell disease. SIGNS AND SYMPTOMS These symptoms usually develop suddenly, or you may notice them after waking up from sleep. Symptoms may include sudden:  Weakness or numbness of your face, arm, or leg, especially on one side of your body.  Confusion.  Trouble speaking (aphasia) or understanding speech.  Trouble seeing with one or both eyes.  Trouble walking or difficulty moving your arms or legs.  Dizziness.  Loss of balance or coordination.  Severe headache with no known cause. The  headache is often described as the worst headache ever experienced. DIAGNOSIS Your health care provider can often determine the presence or absence of an ischemic stroke based on your symptoms, history, and physical exam. CT (computed tomography) of the brain is usually performed to confirm the stroke, determine causes, and determine stroke severity. Other tests may be done to find the cause of the stroke. These tests may include:  ECG (electrocardiogram).  Continuous heart monitoring.  Echocardiogram.  Carotid ultrasound.  MRI.  A scan of the brain circulation.  Blood tests. TREATMENT It is very important to seek treatment at the first sign of stroke symptoms. Your health care provider may perform the following treatments within 6 hours of the onset of stroke symptoms:  Medicine to dissolve the blood clot (thrombolytic).  Inserting a device into the affected artery to remove the blood clot. These treatments may not be effective if too much time has passed since your stroke symptoms began. Even if you do not know when your symptoms began, get treatment as soon as possible. There are other treatment options that may be given, such as:  Oxygen.  IV fluids.  Medicines to thin the blood (anticoagulants).  A procedure to widen blocked arteries. Your treatment will depend on how long you have had your symptoms, the severity of your symptoms, and the cause of your symptoms. Your health care provider will take measures to prevent short-term and long-term complications of stroke, such as:  Breathing foreign material into the lungs (aspiration pneumonia).  Blood clots in the legs.  Bedsores.  Falls. Medicines and dietary changes may be used to help treat and manage risk factors for   stroke, such as diabetes and high blood pressure. If any of your body's functions were impaired by stroke, you may work with physical, speech, or occupational therapists to help you recover. HOME CARE  INSTRUCTIONS  Take medicines only as directed by your health care provider. Follow the directions carefully. Medicines may be used to control risk factors for a stroke. Be sure that you understand all your medicine instructions.  You may be told to take a medicine to thin your blood, such as aspirin or the anticoagulant warfarin. Warfarin needs to be taken exactly as instructed.  Be aware that too much and too little warfarin are both dangerous. Too much warfarin increases the risk of bleeding. Too little warfarin continues to allow the risk of blood clots. While taking warfarin, you will need to have blood tests regularly to measure your blood clotting time. These blood tests usually include both the PT (prothrombin time) and INR (international normalized ratio) tests. The PT and INR results allow your health care provider to adjust your dose of warfarin. The dose can change for many reasons. It is very important that you have your PT and INR levels tested as often as directed by your health care provider and that you have them drawn exactly as directed. It is critically important that you take warfarin exactly as prescribed.  Avoid major changes in your diet, or notify your health care provider before changing your diet. Arrange a visit with a dietitian to answer your questions. Many foods, especially foods that are high in vitamin K, can interfere with warfarin and affect the PT and INR results. You should eat a consistent amount of foods that are high in vitamin K. These include spinach, kale, broccoli, cabbage, collard greens, turnip greens, Brussels sprouts, peas, cauliflower, seaweed, parsley, beef liver, pork liver, green tea, and soybean oil.  Many medicines can interfere with warfarin and affect the PT and INR results. You must tell your health care provider about any and all medicines, vitamins, and supplements you take, including aspirin and other over-the-counter anti-inflammatory medicines.  Be especially cautious with aspirin and anti-inflammatory medicines. Ask your health care provider before taking these. Do not start taking or stop taking any prescribed medicine or over-the-counter medicine, except on the advice of your health care provider or pharmacist.  Warfarin can have side effects, such as easy bruising or difficulty in stopping bleeding. You will need to hold pressure over cuts for longer than usual. Ask your health care provider or pharmacist about other potential side effects of warfarin.  Avoid sports or activities that may cause injury or bleeding.  Be mindful when shaving, flossing your teeth, or handling sharp objects.  Drinking alcohol frequently can increase the effect of warfarin, leading to excess bleeding. It is best to avoid alcoholic drinks or to consume only very small amounts while taking warfarin. Notify your health care provider if you change your alcohol intake.  Notify your dentist or other health care providers before you undergo any procedures in which bleeding may occur.  If swallow studies have determined that your swallowing reflex is present, you should eat healthy foods. Foods may need to be a soft or pureed consistency, or you may need to take small bites in order to avoid aspirating or choking.  Follow physical activity guidelines as directed by your health care team.  Do not use any tobacco products, including cigarettes, chewing tobacco, or electronic cigarettes. If you smoke, quit. If you need help quitting, ask your health care   provider.  A safe home environment is important to reduce the risk of falls. Your health care provider may arrange for specialists to evaluate your home. Having grab bars in the bedroom and bathroom is often important. Your health care provider may arrange for equipment to be used at home, such as raised toilets and a seat for the shower.  Ongoing physical, occupational, and speech therapy may be needed to maximize  your recovery after a stroke. If you have been advised to use a walker or a cane, use it at all times. Be sure to keep your therapy appointments.  Keep all follow-up visits with your health care provider. This is very important. This includes any referrals, therapy, rehabilitation, and lab tests. Proper follow-up can prevent another stroke from occurring. PREVENTION The risk of a stroke can be decreased by appropriately treating high blood pressure, high cholesterol, diabetes, heart disease, and obesity. It can also be decreased by quitting smoking, limiting alcohol, and staying physically active. SEEK IMMEDIATE MEDICAL CARE IF:  You have sudden weakness or numbness of your face, arm, or leg, especially on one side of your body.  You have sudden confusion.  You have sudden trouble speaking (aphasia) or understanding.  You have sudden trouble seeing with one or both eyes.  You have sudden trouble walking or difficulty moving your arms or legs.  You have sudden dizziness.  You have a sudden loss of balance or coordination.  You have a sudden, severe headache with no known cause.  You have a partial or total loss of consciousness. Any of these symptoms may represent a serious problem that is an emergency. Do not wait to see if the symptoms will go away. Get medical help right away. Call your local emergency services (911 in [place] Do not drive yourself to the hospital.   This information is not intended to replace advice given to you by your health care provider. Make sure you discuss any questions you have with your health care provider.   Document Released: 03/27/2005 Document Revised: 04/17/2014 Document Reviewed: 09/15/2013 Elsevier Interactive Patient Education 2016 [employer]  Clopidogrel tablets What is this medicine? CLOPIDOGREL (kloh PID oh grel) helps to prevent blood clots. This medicine is used to prevent heart attack, stroke, or other vascular events in people who are  at high risk. This medicine may be used for other purposes; ask your health care provider or pharmacist if you have questions. What should I tell my health care provider before I take this medicine? They need to know if you have any of the following conditions: -bleeding disorder -bleeding in the brain -planned surgery -stomach or intestinal ulcers -stroke or transient ischemic attack -an unusual or allergic reaction to clopidogrel, other medicines, foods, dyes, or preservatives -pregnant or trying to get pregnant -breast-feeding How should I use this medicine? Take this medicine by mouth with a drink of water. Follow the directions on the prescription label. You may take this medicine with or without food. Take your medicine at regular intervals. Do not take your medicine more often than directed. Talk to your pediatrician regarding the use of this medicine in children. Special care may be needed. Overdosage: If you think you have taken too much of this medicine contact a poison control center or emergency room at once. NOTE: This medicine is only for you. Do not share this medicine with others. What if I miss a dose? If you miss a dose, take it as soon as you can. If it  is almost time for your next dose, take only that dose. Do not take double or extra doses. What may interact with this medicine? -aspirin -blood thinners like cilostazol, enoxaparin, ticlopidine, and warfarin -certain medicines for depression like citalopram, fluoxetine, and fluvoxamine -certain medicines for fungal infections like ketoconazole, fluconazole, and voriconazole -certain medicines for HIV infection like delavirdine, efavirenz, and etravirine -certain medicines for seizures like felbamate, oxcarbazepine, and phenytoin -chloramphenicol -fluvastatin -isoniazid, INH -medicines for inflammation like ibuprofen and naproxen -modafinil -nicardipine -over-the counter supplements like echinacea, feverfew, fish  oil, garlic, ginger, ginkgo, green tea, horse chestnut -quinine -stomach acid blockers like cimetidine, omeprazole, and esomeprazole -tamoxifen -tolbutamide -topiramate -torsemide This list may not describe all possible interactions. Give your health care provider a list of all the medicines, herbs, non-prescription drugs, or dietary supplements you use. Also tell them if you smoke, drink alcohol, or use illegal drugs. Some items may interact with your medicine. What should I watch for while using this medicine? Visit your doctor or health care professional for regular check ups. Do not stop taking your medicine unless your doctor tells you to. Notify your doctor or health care professional and seek emergency treatment if you develop breathing problems; changes in vision; chest pain; severe, sudden headache; pain, swelling, warmth in the leg; trouble speaking; sudden numbness or weakness of the face, arm or leg. These can be signs that your condition has gotten worse. If you are going to have surgery or dental work, tell your doctor or health care professional that you are taking this medicine. Certain genetic factors may reduce the effect of this medicine. Your doctor may use genetic tests to determine treatment. What side effects may I notice from receiving this medicine? Side effects that you should report to your doctor or health care professional as soon as possible: -allergic reactions like skin rash, itching or hives, swelling of the face, lips, or tongue -breathing problems -changes in vision -fever -signs and symptoms of bleeding such as bloody or black, tarry stools; red or dark-brown urine; spitting up blood or brown material that looks like coffee grounds; red spots on the skin; unusual bruising or bleeding from the eye, gums, or nose -sudden weakness -unusual bleeding or bruising Side effects that usually do not require medical attention (report to your doctor or health care  professional if they continue or are bothersome): -constipation or diarrhea -headache -pain in back or joints -stomach upset This list may not describe all possible side effects. Call your doctor for medical advice about side effects. You may report side effects to FDA at 1-800-FDA-1088. Where should I keep my medicine? Keep out of the reach of children. Store at room temperature of 59 to 86 degrees F (15 to 30 degrees C). Throw away any unused medicine after the expiration date. NOTE: This sheet is a summary. It may not cover all possible information. If you have questions about this medicine, talk to your doctor, pharmacist, or health care provider.    2016, [employer]vier/[employer]. (2012-07-23 16:34:37)

## 2015-12-19 NOTE — Progress Notes (Signed)
OT Cancellation Note  Patient Details Name: DEDRIC LALAMA MRN: [id number] DOB: 03/30/1941   Cancelled Treatment:    Reason Eval/Treat Not Completed: Patient at procedure or test/ unavailable (CTA).  Binnie Kand M.S., OTR/L Pager: [phone]  12/19/2015, 8:55 AM

## 2015-12-19 NOTE — Discharge Summary (Signed)
Physician Discharge Summary  Damon Foley [id number] DOB: 11-Jul-1940 DOA: 12/18/2015  PCP: [name], MD  Admit date: 12/18/2015 Discharge date: 12/19/2015  Admitted From: Home Disposition: Home   Recommendations for Outpatient Follow-up:  1. Follow up with vascular surgery: Dr. 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 2. Monitor for tolerance of and adherence to Plavix: This was started for secondary stroke prevention. Aspirin was discontinued.  3. Please follow up Hb A1c drawn on 9/10, pending at time of discharge.   Home Health: None Equipment/Devices: None  Discharge Condition: Stable CODE STATUS: Full Diet recommendation: Heart healthy  Brief/Interim Summary: Damon Foley a 75 y.o.malewith a history of HTN and hypothyroidism who presented with left arm weakness and numbness. MRI of the brain showed nonhemorrhagic infarct of the right posterior frontal lobe. He had vision changes in the right eye 2 weeks prior, told by his ophthalmologist that it was a cholesterol embolus. Outpatient CVA work up included carotid doppler which had no significant stenosis, but pt had not gotten MRI. His symptoms have resolved since admission. Echo showed EF [id number], grade 1 diastolic dysfunction, and did not show a thrombus. CT angriogram of the neck showed > 50% stenosis of the right ICA, so vascular surgery was consulted and recommended right carotid endarterectomy. The patient declined this procedure during this admission and requested discharge. He will be contacted to schedule CEA later this week. Other medical conditions (HTN, hypothyroidism, and BPH) were stable.   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 This is also the cause of your recent vision changes. It is highly likely that this will continue to put you at high risk for strokes, so a carotid endarterectomy was recommended by the vascular surgeon. You have elected to delay this until later in the week. The surgeon, Dr. Donnetta Hutching, has reported that his office will contact you tomorrow to set up a time for the surgery.  - STOP taking aspirin and START taking plavix 75mg  daily (this has been sent to your pharmacy) - Definitely follow up with the vascular surgeon - If you experience any new weakness, numbness, tingling, vision changes, fainting, slurred speech, or headache you must return for urgent medical evaluation.   Increase activity slowly    Complete by:  As directed       Medication List    STOP taking these medications   aspirin EC 81 MG tablet     TAKE these medications   atenolol 50 MG tablet Commonly known as:  TENORMIN Take 1 tablet (50 mg total) by mouth daily.   atorvastatin 40 MG tablet Commonly known as:  LIPITOR Take 1 tablet (40 mg total) by mouth at bedtime.   clopidogrel 75 MG tablet Commonly known as:  PLAVIX Take 1 tablet (75 mg total) by mouth daily.   levothyroxine 150 MCG tablet Commonly known as:  SYNTHROID, LEVOTHROID Take 1 tablet (150 mcg  total) by mouth daily before breakfast.   lisinopril 40 MG tablet Commonly known as:  PRINIVIL,ZESTRIL Take 1 tablet (40 mg total) by mouth daily.   meloxicam 7.5 MG tablet Commonly known as:  MOBIC Take 1-2 tablets (7.5-15 mg total) by mouth daily as needed for pain. What changed:  how much to take  when to take this   silodosin  8 MG Caps capsule Commonly known as:  RAPAFLO Take 1 capsule (8 mg total) by mouth daily with breakfast.      Follow-up [place], MD. Schedule an appointment as soon as possible for a visit in 1 month(s).   Specialty:  Internal Medicine Contact information: [address] [place] [address] [phone]        Curt Jews, MD. Schedule an appointment as soon as possible for a visit in 1 day(s).   Specialties:  Vascular Surgery, Cardiology Contact information: [address] [place] [address] [phone]          No Known Allergies  Consultations:  Neurology, Dr. Erlinda Hong  Vascular surgery, Dr. Donnetta Hutching  Procedures/Studies: Ct Head Wo Contrast  Result Date: 12/18/2015 CLINICAL DATA:  Patient with episode of left arm/hand numbness and tingling starting at 6:15 morning. Also has history of blurred vision in right eye. EXAM: CT HEAD WITHOUT CONTRAST TECHNIQUE: Contiguous axial images were obtained from the base of the skull through the vertex without intravenous contrast. COMPARISON:  None. FINDINGS: Brain: No acute territorial infarction or intracranial hemorrhage. No focal mass lesion identified. No mass effect or midline shift. There is a small focal hypodensity in the posterior limb of right external capsule, series 2, image number 17. Gray-white matter differentiation is otherwise preserved. There are scattered right cortical calcifications. Vascular: There are atherosclerotic calcifications visualized within the vertebrobasilar system and within the intracranial internal carotid arteries at the skullbase. Left vertebral artery appears dominant. Skull: No fractures identified. Mastoid air cells are clear. No lytic or sclerotic lesions. Sinuses/Orbits: Paranasal sinuses are clear.  Globes appear intact. Other: None IMPRESSION: 1. Small focal hypodensity in the region of the posterior limb of right external capsule could relate to age-indeterminate  lacune. Of note, patient is scheduled for MRI of the brain ; this area could be evaluated at the time of MRI. 2. Otherwise no acute intracranial abnormality. 3. Atherosclerotic vascular disease. Electronically Signed   By: Donavan Foil M.D.   On: 12/18/2015 08:25   Ct Angio Neck W Or Wo Contrast  Result Date: 12/19/2015 CLINICAL DATA:  Acute to subacute posterior right frontal lobe nonhemorrhagic infarct. Left arm numbness yesterday which has since resolved. EXAM: CT ANGIOGRAPHY NECK TECHNIQUE: Multidetector CT imaging of the neck was performed using the standard protocol during bolus administration of intravenous contrast. Multiplanar CT image reconstructions and MIPs were obtained to evaluate the vascular anatomy. Carotid stenosis measurements (when applicable) are obtained utilizing NASCET criteria, using the distal internal carotid diameter as the denominator. CONTRAST:  50 mL Isovue 370 COMPARISON:  MRI brain and MRA head 12/18/2015 FINDINGS: Aortic arch: A 3 vessel arch configuration is present. Atherosclerotic calcifications are present without significant stenosis or aneurysm. Right carotid system: The right common carotid artery is within normal limits. Dense irregular calcifications are present along the medial aspect of the right internal carotid artery at the bifurcation. The lumen is narrowed to 2.3 mm. This compares with a more normal distal segment of 5.0 mm. Left carotid system: The proximal left common carotid artery  is within normal limits. Mural calcifications are present in the distal left common carotid artery without a significant stenosis. Additional calcifications are present in they proximal left internal carotid artery. Minimal luminal diameter is 2.9 mm. The more distal cervical left ICA is normal. Vertebral arteries:The vertebral arteries both originate from the subclavian arteries. Left vertebral artery is the dominant vessel. Stenosis versus artifact. Moderate proximal stenosis is  suspected. The more distal left vertebral artery is within normal limits from the C6 level and above. The hypoplastic right vertebral artery is followed to its termination at the PICA. Skeleton: Advanced atherosclerotic said advanced degenerative changes are present within the cervical spine. There is ankylosis across the disc space at C3-4 and C5-6. There is straightening of the normal cervical lordosis. Vertebral body heights and AP alignment are maintained. No focal lytic or blastic lesions are present. Other neck: The thyroid is somewhat heterogeneous without a dominant lesion. No significant adenopathy is present. Salivary glands are within normal limits. No focal mucosal or submucosal lesions are present. Calcification at the right palatine tonsil likely reflects prior infection/inflammation. Gas within the left subclavian vein is likely related to IV injection. Upper chest: Mild dependent atelectasis is present at the lung apices bilaterally. IMPRESSION: 1. Slightly greater than 50% stenosis of the right internal carotid artery at its origin with dense irregular calcifications along the medial wall. 2. Irregular calcifications in diffuse atherosclerotic disease in the proximal dominant left vertebral artery with suspected moderate proximal stenosis. Artifact exaggerates the appearance. 3. Atherosclerotic changes in the distal left common carotid artery and proximal left internal carotid artery without a significant stenosis relative to the more distal vessel. 4. Advanced degenerative changes within the cervical spine as described. Electronically Signed   By: San Morelle M.D.   On: 12/19/2015 09:08   Mr Brain Wo Contrast  Result Date: 12/18/2015 CLINICAL DATA:  New onset left arm numbness beginning 2 hours ago. EXAM: MRI HEAD WITHOUT CONTRAST TECHNIQUE: Multiplanar, multiecho pulse sequences of the brain and surrounding structures were obtained without intravenous contrast. COMPARISON:  None.  FINDINGS: Brain: An acute nonhemorrhagic infarct is present in the posterior right frontal lobe along the inferior surface of the right frontal operculum. There is no evidence for acute ischemia within the posterior right basal ganglia or internal capsule. The cortical infarct is not visible on the prior CT scan. Subtle T2 changes are associated with the area of acute infarct. There is a remote lacunar infarct along the posterior aspect of the right lentiform nucleus and external capsule which correlates with the finding at CT. No other significant white matter disease is present. The ventricles are of normal size. No significant extraaxial fluid collection is present. The internal auditory canals are within normal limits bilaterally. The brainstem and cerebellum are normal. Vascular: Flow is present in the major intracranial arteries. Skull and upper cervical spine: The skullbase is within normal limits. Intracranial midline structures are within normal limits. Degenerative changes are present in the upper cervical spine. The upper cervical cord is within normal limits Sinuses/Orbits: The paranasal sinuses and mastoid air cells are clear Other: IMPRESSION: 1. Acute nonhemorrhagic infarct within the posterior right frontal lobe along the inferior surface of the right frontal operculum. 2. Remote lacunar infarct in the posterior right lentiform nucleus and external capsule corresponds with the finding at CT. 3. No other acute intracranial abnormality. These results were called by telephone at the time of interpretation on 12/18/2015 at 9:15 am to Dr. Isla Pence , who verbally  acknowledged these results. Electronically Signed   By: San Morelle M.D.   On: 12/18/2015 09:26   US Carotid Duplex Bilateral  Result Date: 12/10/2015 CLINICAL DATA:  Cholesterol embolism to the right eye yesterday. History of stroke / TIA, hypertension and hyperlipidemia. EXAM: BILATERAL CAROTID DUPLEX ULTRASOUND TECHNIQUE: Pearline Cables  scale imaging, color Doppler and duplex ultrasound were performed of bilateral carotid and vertebral arteries in the neck. COMPARISON:  None. FINDINGS: Criteria: Quantification of carotid stenosis is based on velocity parameters that correlate the residual internal carotid diameter with NASCET-based stenosis levels, using the diameter of the distal internal carotid lumen as the denominator for stenosis measurement. The following velocity measurements were obtained: RIGHT ICA:  91/28 cm/sec CCA:  [id number] cm/sec SYSTOLIC ICA/CCA RATIO:  1.0 DIASTOLIC ICA/CCA RATIO:  2.6 ECA:  82 cm/sec LEFT ICA:  81/30 cm/sec CCA:  [id number] cm/sec SYSTOLIC ICA/CCA RATIO:  0.9 DIASTOLIC ICA/CCA RATIO:  1.7 ECA:  67 cm/sec RIGHT CAROTID ARTERY: There is a minimal amount of eccentric mixed echogenic plaque within distal aspect of the right common carotid artery (image 21 and 22). There is a minimal amount of eccentric mixed echogenic plaque involving the origin and proximal aspects of the right internal carotid artery (image 34), not resulting in elevated peak systolic velocities within the interrogated course of the right internal carotid artery to suggest a hemodynamically significant stenosis. RIGHT VERTEBRAL ARTERY:  Antegrade Flow LEFT CAROTID ARTERY: There is a minimal amount of eccentric mixed echogenic plaque within the left carotid bulb (images 61 and 62), extending to involve the origin and proximal aspects of the left internal carotid artery (image 71), not resulting in elevated peak systolic velocities within the interrogated course the left internal carotid artery to suggest a hemodynamically significant stenosis. LEFT VERTEBRAL ARTERY:  Antegrade flow IMPRESSION: Minimal amount of bilateral atherosclerotic plaque, right greater than left, not resulting in a hemodynamically significant stenosis within either internal carotid artery. Electronically Signed   By: Sandi Mariscal M.D.   On: 12/10/2015 07:43   Dg Knee Complete 4 Views  Right  Result Date: 12/14/2015 CLINICAL DATA:  Right knee pain with clicking for 3 weeks. Instability. EXAM: RIGHT KNEE - COMPLETE 4+ VIEW COMPARISON:  None. FINDINGS: Mild articular space narrowing in the medial compartment. Vascular calcifications noted. No overt knee effusion. Minimal prepatellar subcutaneous edema. IMPRESSION: 1. Minimal prepatellar subcutaneous edema. Mild degenerative medial compartmental articular space narrowing. Otherwise, no significant abnormalities are observed. If pain persists despite conservative therapy, MRI may be warranted for further characterization. Electronically Signed   By: Van Clines M.D.   On: 12/14/2015 17:06   Mr Jodene Nam Head/brain [id number] Cm  Result Date: 12/18/2015 CLINICAL DATA:  Posterior right frontal lobe infarct. New onset left arm weakness and numbness. EXAM: MRA HEAD WITHOUT CONTRAST TECHNIQUE: Angiographic images of the Circle of Willis were obtained using MRA technique without intravenous contrast. COMPARISON:  MRI brain from the same day. FINDINGS: The internal carotid arteries are within normal limits from the high cervical segments through the ICA termini bilaterally. The A1 and M1 segments are normal. The anterior communicating artery is patent. The MCA bifurcations are within normal limits. ACA and MCA branch vessels are unremarkable. The left vertebral artery is dominant. The right vertebral artery is hypoplastic and terminates at the PICA. Left PICA origin is visualized and normal. The right AICA is dominant. The basilar artery is within normal limits. Both posterior cerebral arteries originate from the basilar tip. The PCA branch vessels are within normal limits.  IMPRESSION: Normal variant MRA circle of Willis without significant proximal stenosis, aneurysm, or branch vessel occlusion. No focal lesion to account for the acute infarct. Electronically Signed   By: San Morelle M.D.   On: 12/18/2015 16:18   2D Echocardiogram: No mention of  thrombus. Left ventricle: The cavity size was normal. Wall thickness was   normal. Systolic function was vigorous. The estimated ejection   fraction was in the range of 65% to 70%. Wall motion was normal;   there were no regional wall motion abnormalities. Doppler   parameters are consistent with abnormal left ventricular   relaxation (grade 1 diastolic dysfunction). Aortic valve: There was mild regurgitation.  Subjective: Patient reports complete resolution of symptoms. No new deficits. Wants to go home. Has preexisting commitments scheduled for the next 3 days.   Discharge Exam: Vitals:   12/19/15 0617 12/19/15 1005  BP: (!) 150/69 133/66  Pulse: (!) 56 (!) 59  Resp: 16 18  Temp: 98.2 F (36.8 C) 98.1 F (36.7 C)   Vitals:   12/18/15 2230 12/19/15 0030 12/19/15 0617 12/19/15 1005  BP: 131/72 132/65 (!) 150/69 133/66  Pulse:   (!) 56 (!) 59  Resp: 16 18 16 18   Temp: 98 F (36.7 C) 98.2 F (36.8 C) 98.2 F (36.8 C) 98.1 F (36.7 C)  TempSrc: Oral Oral Oral Oral  SpO2: 98% 99% 95% 96%  Weight:      Height:       General: Pt is alert, awake, not in acute distress Cardiovascular: RRR, S1/S2 +, no murmur, +right carotid bruit Respiratory: CTA bilaterally, no wheezing, no rhonchi Abdominal: Soft, NT, ND, bowel sounds + Extremities: no edema, no cyanosis Neuro: Alert, oriented, normal speech and gait. No focal sensorimotor deficits.  Psych: Appropriate behavior, has capacity to make medical decisions.  The results of significant diagnostics from this hospitalization (including imaging, microbiology, ancillary and laboratory) are listed below for reference.    Microbiology: None  Labs: Basic Metabolic Panel:  Recent Labs Lab 12/14/15 1508 12/18/15 0745  NA 137 135  K 4.3 3.9  CL 104 105  CO2 27 24  GLUCOSE 107* 125*  BUN 18 23*  CREATININE 1.27 1.26*  CALCIUM 9.4 9.8   Liver Function Tests:  Recent Labs Lab 12/14/15 1508 12/18/15 0745  AST 21 26  ALT  31 33  ALKPHOS 36* 35*  BILITOT 1.3* 1.4*  PROT 6.9 6.8  ALBUMIN 4.5 4.3   CBC:  Recent Labs Lab 12/14/15 1508 12/18/15 0745  WBC 4.9 5.1  NEUTROABS 3.2  --   HGB 16.1 15.3  HCT 45.8 42.5  MCV 93.3 89.7  PLT 160.0 126*   Cardiac Enzymes:  Recent Labs Lab 12/18/15 0745  TROPONINI <0.03   Hgb A1c Pending Lipid Profile   Recent Labs  12/19/15 0304  CHOL 163  HDL 35*  LDLCALC 85  TRIG 213*  CHOLHDL 4.7   Urinalysis    Component Value Date/Time   COLORURINE YELLOW 12/18/2015 0851   APPEARANCEUR CLEAR 12/18/2015 0851   LABSPEC 1.019 12/18/2015 0851   PHURINE 5.0 12/18/2015 0851   GLUCOSEU NEGATIVE 12/18/2015 0851   GLUCOSEU NEGATIVE 04/17/2014 1100   HGBUR NEGATIVE 12/18/2015 0851   HGBUR negative 12/24/2006 [place] 12/18/2015 0851   KETONESUR NEGATIVE 12/18/2015 0851   PROTEINUR NEGATIVE 12/18/2015 0851   UROBILINOGEN 0.2 04/17/2014 1100   NITRITE NEGATIVE 12/18/2015 0851   LEUKOCYTESUR NEGATIVE 12/18/2015 0851   Time coordinating discharge: Over 30 minutes  Vance Gather, MD  [facility] 12/19/2015, 4:30 PM Pager [phone]  If 7PM-7AM, please contact night-coverage [email or web address] Password [email or web address]

## 2015-12-19 NOTE — Progress Notes (Signed)
PROGRESS NOTE  Damon Foley  [id number] DOB: Apr 02, 1941 DOA: 12/18/2015 PCP: [name], MD   Brief Narrative: Damon Foley is a 75 y.o. male with PMH of HTN and hypothyroidism who presented with left arm weakness and numbness. MRI of the brain showed nonhemorrhagic infarct of the right posterior frontal lobe. He had vision changes in the right eye 2 weeks prior, told by his ophthalmologist that it was a cholesterol embolus. Outpatient CVA work up included carotid doppler, but pt had not gotten MRI. Symptoms have resolved since admission. 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   Assessment & Plan: Active Problems:   HTN (hypertension)   GERD   CVA (cerebral infarction)   Acute CVA (cerebrovascular accident) ([place])  Acute infarct of right posterior frontal lobe: Consistent with left sided weakness, paresthesia prior to admission, now resolved. CTA neck showed >50% stenosis of right ICA, the likely source of embolization causing this CVA and previous amaurosis fugax of right eye.  - Further work up underway: echo pending.  - Consulted VVS to evaluate for right CEA.  - Antithrombotic changed from aspirin to plavix monotherapy.  - Continue statin (LDL 85)  HTN: Chronic, stable.  - Bradycardic on arrival, holding beta blocker. - Hold lisinopril also for permissive HTN.   Hypothyroidism: Chronic, stable. Recent TSH 4.8. - Continue home dose synthroid.   BPH: Chronic, stable - Continue flomax, monitor for urinary retention.   DVT prophylaxis: Lovenox Code Status: Full Family Communication: None at bedside, pt will contact family and let me know if they have questions.  Disposition Plan: Pending further evaluation and vascular surgery consult.  Consultants:   Neurology, Dr. Erlinda Hong  VVS, Dr. Donnetta Hutching, has been paged.  Procedures:   Echo pending  Antimicrobials:  None    Subjective: Pt reports complete resolution of symptoms. Wants to go home.   Objective: Vitals:   12/18/15 2230 12/19/15 0030 12/19/15 0617 12/19/15 1005  BP: 131/72 132/65 (!) 150/69 133/66  Pulse:   (!) 56 (!) 59  Resp: 16 18 16 18   Temp: 98 F (36.7 C) 98.2 F (36.8 C) 98.2 F (36.8 C) 98.1 F (36.7 C)  TempSrc: Oral Oral Oral Oral  SpO2: 98% 99% 95% 96%  Weight:      Height:        Intake/Output Summary (Last 24 hours) at 12/19/15 1255 Last data filed at 12/19/15 0618  Gross per 24 hour  Intake              240 ml  Output              375 ml  Net             -135 ml   Filed Weights   12/18/15 0709  Weight: 99.8 kg (220 lb)    Examination: General exam: 75 y.o. male in no distress  Respiratory system: Non-labored breathing. Clear to auscultation bilaterally.  Cardiovascular system: Regular rate and rhythm. No murmur, rub, or gallop. No JVD, and no pedal edema. Gastrointestinal system: Abdomen soft, non-tender, non-distended, with normoactive bowel sounds. No organomegaly or masses felt. Central nervous system: Alert and oriented. No focal neurological deficits. Extremities: Warm, no deformities Skin: No rashes, lesions no ulcers Psychiatry: Judgement and insight appear normal. Mood & affect appropriate.   Data Reviewed: I have personally reviewed following labs and imaging studies  CBC:  Recent Labs Lab  12/14/15 1508 12/18/15 0745  WBC 4.9 5.1  NEUTROABS 3.2  --   HGB 16.1 15.3  HCT 45.8 42.5  MCV 93.3 89.7  PLT 160.0 123XX123*   Basic Metabolic Panel:  Recent Labs Lab 12/14/15 1508 12/18/15 0745  NA 137 135  K 4.3 3.9  CL 104 105  CO2 27 24  GLUCOSE 107* 125*  BUN 18 23*  CREATININE 1.27 1.26*  CALCIUM 9.4 9.8   GFR: Estimated Creatinine Clearance: 60 mL/min (by C-G formula based on SCr of 1.26 mg/dL). Liver Function Tests:  Recent Labs Lab 12/14/15 1508 12/18/15 0745  AST 21 26  ALT 31 33  ALKPHOS 36* 35*  BILITOT 1.3* 1.4*  PROT  6.9 6.8  ALBUMIN 4.5 4.3   No results for input(s): LIPASE, AMYLASE in the last 168 hours. No results for input(s): AMMONIA in the last 168 hours. Coagulation Profile: No results for input(s): INR, PROTIME in the last 168 hours. Cardiac Enzymes:  Recent Labs Lab 12/18/15 0745  TROPONINI <0.03   BNP (last 3 results) No results for input(s): PROBNP in the last 8760 hours. HbA1C: No results for input(s): HGBA1C in the last 72 hours. CBG: No results for input(s): GLUCAP in the last 168 hours. Lipid Profile:  Recent Labs  12/19/15 0304  CHOL 163  HDL 35*  LDLCALC 85  TRIG 213*  CHOLHDL 4.7   Thyroid Function Tests: No results for input(s): TSH, T4TOTAL, FREET4, T3FREE, THYROIDAB in the last 72 hours. Anemia Panel: No results for input(s): VITAMINB12, FOLATE, FERRITIN, TIBC, IRON, RETICCTPCT in the last 72 hours. Urine analysis:    Component Value Date/Time   COLORURINE YELLOW 12/18/2015 [place] 12/18/2015 0851   LABSPEC 1.019 12/18/2015 0851   PHURINE 5.0 12/18/2015 0851   GLUCOSEU NEGATIVE 12/18/2015 0851   GLUCOSEU NEGATIVE 04/17/2014 1100   HGBUR NEGATIVE 12/18/2015 0851   HGBUR negative 12/24/2006 0927   BILIRUBINUR NEGATIVE 12/18/2015 0851   KETONESUR NEGATIVE 12/18/2015 0851   PROTEINUR NEGATIVE 12/18/2015 0851   UROBILINOGEN 0.2 04/17/2014 1100   NITRITE NEGATIVE 12/18/2015 0851   LEUKOCYTESUR NEGATIVE 12/18/2015 0851   Sepsis Labs: @LABRCNTIP (procalcitonin:4,lacticidven:4)  )No results found for this or any previous visit (from the past 240 hour(s)).   Radiology Studies: Ct Head Wo Contrast  Result Date: 12/18/2015 CLINICAL DATA:  Patient with episode of left arm/hand numbness and tingling starting at 6:15 morning. Also has history of blurred vision in right eye. EXAM: CT HEAD WITHOUT CONTRAST TECHNIQUE: Contiguous axial images were obtained from the base of the skull through the vertex without intravenous contrast. COMPARISON:  None.  FINDINGS: Brain: No acute territorial infarction or intracranial hemorrhage. No focal mass lesion identified. No mass effect or midline shift. There is a small focal hypodensity in the posterior limb of right external capsule, series 2, image number 17. Gray-white matter differentiation is otherwise preserved. There are scattered right cortical calcifications. Vascular: There are atherosclerotic calcifications visualized within the vertebrobasilar system and within the intracranial internal carotid arteries at the skullbase. Left vertebral artery appears dominant. Skull: No fractures identified. Mastoid air cells are clear. No lytic or sclerotic lesions. Sinuses/Orbits: Paranasal sinuses are clear.  Globes appear intact. Other: None IMPRESSION: 1. Small focal hypodensity in the region of the posterior limb of right external capsule could relate to age-indeterminate lacune. Of note, patient is scheduled for MRI of the brain ; this area could be evaluated at the time of MRI. 2. Otherwise no acute intracranial abnormality. 3. Atherosclerotic vascular  disease. Electronically Signed   By: Donavan Foil M.D.   On: 12/18/2015 08:25   Ct Angio Neck W Or Wo Contrast  Result Date: 12/19/2015 CLINICAL DATA:  Acute to subacute posterior right frontal lobe nonhemorrhagic infarct. Left arm numbness yesterday which has since resolved. EXAM: CT ANGIOGRAPHY NECK TECHNIQUE: Multidetector CT imaging of the neck was performed using the standard protocol during bolus administration of intravenous contrast. Multiplanar CT image reconstructions and MIPs were obtained to evaluate the vascular anatomy. Carotid stenosis measurements (when applicable) are obtained utilizing NASCET criteria, using the distal internal carotid diameter as the denominator. CONTRAST:  50 mL Isovue 370 COMPARISON:  MRI brain and MRA head 12/18/2015 FINDINGS: Aortic arch: A 3 vessel arch configuration is present. Atherosclerotic calcifications are present  without significant stenosis or aneurysm. Right carotid system: The right common carotid artery is within normal limits. Dense irregular calcifications are present along the medial aspect of the right internal carotid artery at the bifurcation. The lumen is narrowed to 2.3 mm. This compares with a more normal distal segment of 5.0 mm. Left carotid system: The proximal left common carotid artery is within normal limits. Mural calcifications are present in the distal left common carotid artery without a significant stenosis. Additional calcifications are present in they proximal left internal carotid artery. Minimal luminal diameter is 2.9 mm. The more distal cervical left ICA is normal. Vertebral arteries:The vertebral arteries both originate from the subclavian arteries. Left vertebral artery is the dominant vessel. Stenosis versus artifact. Moderate proximal stenosis is suspected. The more distal left vertebral artery is within normal limits from the C6 level and above. The hypoplastic right vertebral artery is followed to its termination at the PICA. Skeleton: Advanced atherosclerotic said advanced degenerative changes are present within the cervical spine. There is ankylosis across the disc space at C3-4 and C5-6. There is straightening of the normal cervical lordosis. Vertebral body heights and AP alignment are maintained. No focal lytic or blastic lesions are present. Other neck: The thyroid is somewhat heterogeneous without a dominant lesion. No significant adenopathy is present. Salivary glands are within normal limits. No focal mucosal or submucosal lesions are present. Calcification at the right palatine tonsil likely reflects prior infection/inflammation. Gas within the left subclavian vein is likely related to IV injection. Upper chest: Mild dependent atelectasis is present at the lung apices bilaterally. IMPRESSION: 1. Slightly greater than 50% stenosis of the right internal carotid artery at its origin  with dense irregular calcifications along the medial wall. 2. Irregular calcifications in diffuse atherosclerotic disease in the proximal dominant left vertebral artery with suspected moderate proximal stenosis. Artifact exaggerates the appearance. 3. Atherosclerotic changes in the distal left common carotid artery and proximal left internal carotid artery without a significant stenosis relative to the more distal vessel. 4. Advanced degenerative changes within the cervical spine as described. Electronically Signed   By: San Morelle M.D.   On: 12/19/2015 09:08   Mr Brain Wo Contrast  Result Date: 12/18/2015 CLINICAL DATA:  New onset left arm numbness beginning 2 hours ago. EXAM: MRI HEAD WITHOUT CONTRAST TECHNIQUE: Multiplanar, multiecho pulse sequences of the brain and surrounding structures were obtained without intravenous contrast. COMPARISON:  None. FINDINGS: Brain: An acute nonhemorrhagic infarct is present in the posterior right frontal lobe along the inferior surface of the right frontal operculum. There is no evidence for acute ischemia within the posterior right basal ganglia or internal capsule. The cortical infarct is not visible on the prior CT scan. Subtle T2  changes are associated with the area of acute infarct. There is a remote lacunar infarct along the posterior aspect of the right lentiform nucleus and external capsule which correlates with the finding at CT. No other significant white matter disease is present. The ventricles are of normal size. No significant extraaxial fluid collection is present. The internal auditory canals are within normal limits bilaterally. The brainstem and cerebellum are normal. Vascular: Flow is present in the major intracranial arteries. Skull and upper cervical spine: The skullbase is within normal limits. Intracranial midline structures are within normal limits. Degenerative changes are present in the upper cervical spine. The upper cervical cord is  within normal limits Sinuses/Orbits: The paranasal sinuses and mastoid air cells are clear Other: IMPRESSION: 1. Acute nonhemorrhagic infarct within the posterior right frontal lobe along the inferior surface of the right frontal operculum. 2. Remote lacunar infarct in the posterior right lentiform nucleus and external capsule corresponds with the finding at CT. 3. No other acute intracranial abnormality. These results were called by telephone at the time of interpretation on 12/18/2015 at 9:15 am to Dr. Isla Pence , who verbally acknowledged these results. Electronically Signed   By: San Morelle M.D.   On: 12/18/2015 09:26   Mr Jodene Nam Head/brain [id number] Cm  Result Date: 12/18/2015 CLINICAL DATA:  Posterior right frontal lobe infarct. New onset left arm weakness and numbness. EXAM: MRA HEAD WITHOUT CONTRAST TECHNIQUE: Angiographic images of the Circle of Willis were obtained using MRA technique without intravenous contrast. COMPARISON:  MRI brain from the same day. FINDINGS: The internal carotid arteries are within normal limits from the high cervical segments through the ICA termini bilaterally. The A1 and M1 segments are normal. The anterior communicating artery is patent. The MCA bifurcations are within normal limits. ACA and MCA branch vessels are unremarkable. The left vertebral artery is dominant. The right vertebral artery is hypoplastic and terminates at the PICA. Left PICA origin is visualized and normal. The right AICA is dominant. The basilar artery is within normal limits. Both posterior cerebral arteries originate from the basilar tip. The PCA branch vessels are within normal limits. IMPRESSION: Normal variant MRA circle of Willis without significant proximal stenosis, aneurysm, or branch vessel occlusion. No focal lesion to account for the acute infarct. Electronically Signed   By: San Morelle M.D.   On: 12/18/2015 16:18    Scheduled Meds: .  stroke: mapping our early stages of  recovery book   Does not apply Once  . atorvastatin  40 mg Oral QHS  . clopidogrel  75 mg Oral Daily  . enoxaparin (LOVENOX) injection  30 mg Subcutaneous Q24H  . levothyroxine  150 mcg Oral QAC breakfast  . tamsulosin  0.4 mg Oral QPC supper   Continuous Infusions: . sodium chloride 100 mL/hr at 12/18/15 1343     LOS: 1 day   Time spent: 25 minutes.  Vance Gather, MD [facility] Pager [phone]  If 7PM-7AM, please contact night-coverage [email or web address] Password [email or web address] 12/19/2015, 12:55 PM

## 2015-12-19 NOTE — Consult Note (Signed)
[facility]ist [facility]  Patient name: Damon Foley MRN: [id number] DOB: 06/22/1940 Sex: male  REASON FOR CONSULT: Symptomatic right carotid disease  HPI: Damon Foley is a 75 y.o. male, who is seen for evaluation of right brain event. He is very pleasant active 75 year old gentleman who several weeks ago had an episode of classic right eye amaurosis fugax. He initially reports it was though is top half of his visual field was loss. He did have some improvement and now has slight amount of continued central blurring but is near baseline. Not seek medical attention regarding this. Yesterday he awoke with numbness and then noted weakness in his left arm. Reports that it was difficult to place his wallet into his pocket and do other routine task. He presented for further evaluation. Workup showed no evidence of acute stroke. He did have CT angiogram and I have reviewed this and discussed the films with the patient. He does have a moderate right carotid stenosis and has extreme irregular eccentric plaque in his bifurcation.  Past Medical History:  Diagnosis Date  . BCC (basal cell carcinoma) 2016   L preauricular/sideburn  . Benign prostatic hypertrophy   . Carotid bruit 2002   per chart review, right, neg u/s  . Diverticulitis   . ETOH abuse 1991   quit  . GERD (gastroesophageal reflux disease)   . Hyperlipidemia   . Hypertension   . 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 Hypothyroidism   . Renal calculus   . Tonsillar tag 2016   Dr. Redmond Baseman, ENT    Family History  Problem Relation Age of Onset  . Heart attack Mother 63  . Prostate cancer Father 92  . Cancer Brother     type?  . Diabetes Neg Hx   . Colon cancer Neg Hx     SOCIAL HISTORY: Social History   Social History  . Marital status: Divorced    Spouse name: N/A  . Number of children: 1  . Years of education: N/A   Occupational History  . retired ,  volunteers at [employer]    .  Retired   Social History Main Topics  . Smoking status: Former Smoker    Quit date: 04/10/1989  . Smokeless tobacco: Never Used  . Alcohol use No     Comment: quit - 04/1989  . Drug use: No  . Sexual activity: Not on file   Other Topics Concern  . Not on file   Social History Narrative   Divorced lives by self    one daughter in [place] to [place] in May 2008 from [place]     No Known Allergies  Current Facility-Administered Medications  Medication Dose Route Frequency Provider Last Rate Last Dose  .  stroke: mapping our Siegfried Vieth stages of recovery book   Does not apply Once Kinnie Feil, MD      . atorvastatin (LIPITOR) tablet 40 mg  40 mg Oral QHS Kinnie Feil, MD   40 mg at 12/18/15 2155  . clopidogrel (PLAVIX) tablet 75 mg  75 mg Oral Daily Kinnie Feil, MD   75 mg at 12/19/15 0928  . enoxaparin (LOVENOX) injection 40 mg  40 mg Subcutaneous Q24H Patrecia Pour, MD      .  hydrALAZINE (APRESOLINE) injection 10 mg  10 mg Intravenous Q6H PRN Kinnie Feil, MD      . levothyroxine (SYNTHROID, LEVOTHROID) tablet 150 mcg  150 mcg Oral QAC breakfast Kinnie Feil, MD   150 mcg at 12/19/15 0928  . senna-docusate (Senokot-S) tablet 1 tablet  1 tablet Oral QHS PRN Kinnie Feil, MD      . tamsulosin (FLOMAX) capsule 0.4 mg  0.4 mg Oral QPC supper Kinnie Feil, MD        REVIEW OF SYSTEMS:  Reviewed in history and physical with nothing to add  PHYSICAL EXAM: Vitals:   12/18/15 2230 12/19/15 0030 12/19/15 0617 12/19/15 1005  BP: 131/72 132/65 (!) 150/69 133/66  Pulse:   (!) 56 (!) 59  Resp: 16 18 16 18   Temp: 98 F (36.7 C) 98.2 F (36.8 C) 98.2 F (36.8 C) 98.1 F (36.7 C)  TempSrc: Oral Oral Oral Oral  SpO2: 98% 99% 95% 96%  Weight:      Height:        GENERAL: The patient is a well-nourished male, in no acute distress. The vital signs are documented above. VASCULAR: Normal carotid pulsation. 2+ radial 2-3+ dorsalis pedis  pulses bilaterally PULMONARY: There is good air exchange  ABDOMEN: Soft and non-tender  MUSCULOSKELETAL: There are no major deformities or cyanosis. NEUROLOGIC: No focal weakness or paresthesias are detected. SKIN: There are no ulcers or rashes noted. PSYCHIATRIC: The patient has a normal affect.  DATA:  CT angiogram reviewed. Shows moderate to severe narrowing but extreme calcification and his bifurcation plaque.  MEDICAL ISSUES: Symptomatic right carotid stenosis with a recent episode of amaurosis fugax and TIA yesterday. Fortunately has had complete resolution of his symptoms. Explained the etiology of this and explained the recommendation for right carotid endarterectomy for reduction of stroke risk. Explained the procedure and expected one night hospitalization following the procedure. Explain the 1-2% risk of stroke with surgery itself. Have offered to proceed with surgery tomorrow. The patient reports that he has commitments and is unable to consider surgery on Monday Tuesday or Wednesday. I feel that it is acceptable to delay this brief period of time since he is not having crescendo TIAs. Again explained the critical importance for him to report immediately to the hospital should he have any further deficits. Mild office will contact him tomorrow on Monday to coordinate surgery at his convenience later this week   Rosetta Posner, MD [facility] Tel [phone] Pager [phone]

## 2015-12-19 NOTE — Evaluation (Signed)
Occupational Therapy Evaluation and Discharge Patient Details Name: Damon Foley MRN: [id number] DOB: 06/20/1940 Today's Date: 12/19/2015    History of Present Illness 75 y.o. male with PMH of HTN and hypothyroidism who presented with left arm weakness and numbness. MRI of the brain showed nonhemorrhagic infarct of the right posterior frontal lobe. He had vision changes in the right eye 2 weeks prior, told by his ophthalmologist that it was a cholesterol embolus.    Clinical Impression   Pt reports he was independent with ADL PTA. Currently pt at baseline and is independent with ADL and functional mobility. Pt able to perform higher level balance activities without LOB or unsteadiness. Pt reports all symptoms have resolved without any residual deficits. Educated pt on signs and symptoms of stroke and need to return to hospital of he is experiencing any of these symptoms. Pt planning to d/c home alone. No further acute OT needs identified; signing off at this time.     Follow Up Recommendations  No OT follow up    Equipment Recommendations  None recommended by OT    Recommendations for Other Services       Precautions / Restrictions Precautions Precautions: None Restrictions Weight Bearing Restrictions: No      Mobility Bed Mobility               General bed mobility comments: Pt OOB in chair upon arrival.  Transfers Overall transfer level: Independent Equipment used: None                  Balance Overall balance assessment: Independent;No apparent balance deficits (not formally assessed)                                          ADL Overall ADL's : Independent                                       General ADL Comments: Pt able to perform functional mobility and higher level balance activities without LOB or unsteadiness. Educated pt on signs and symptoms of stroke and importance to return to hospital if  experiencing stroke signs/symptoms.      Vision Vision Assessment?: No apparent visual deficits   Perception     Praxis      Pertinent Vitals/Pain Pain Assessment: No/denies pain     Hand Dominance Right   Extremity/Trunk Assessment Upper Extremity Assessment Upper Extremity Assessment: Overall WFL for tasks assessed   Lower Extremity Assessment Lower Extremity Assessment: Defer to PT evaluation   Cervical / Trunk Assessment Cervical / Trunk Assessment: Normal   Communication Communication Communication: No difficulties   Cognition Arousal/Alertness: Awake/alert Behavior During Therapy: WFL for tasks assessed/performed Overall Cognitive Status: Within Functional Limits for tasks assessed                     General Comments       Exercises       Shoulder Instructions      Home Living Family/patient expects to be discharged to:: Private residence Living Arrangements: Alone Available Help at Discharge: Family;Friend(s);Available PRN/intermittently Type of Home: House (townhouse)       Home Layout: Two level;Bed/bath upstairs     Bathroom Shower/Tub: Tub/shower unit Shower/tub characteristics: Door Biochemist, clinical: [place]  Equipment: None   Additional Comments: In the process of selling house in [place] and moving up to [place] where daughter lives.      Prior Functioning/Environment Level of Independence: Independent             OT Diagnosis: Generalized weakness   OT Problem List:     OT Treatment/Interventions:      OT Goals(Current goals can be found in the care plan section) Acute Rehab OT Goals Patient Stated Goal: return home OT Goal Formulation: All assessment and education complete, DC therapy  OT Frequency:     Barriers to D/C:            Co-evaluation              End of Session Nurse Communication: Mobility status  Activity Tolerance: Patient tolerated treatment well Patient left: in chair;with call  bell/phone within reach   Time: [id number] OT Time Calculation (min): 14 min Charges:  OT General Charges $OT Visit: 1 Procedure OT Evaluation $OT Eval Low Complexity: 1 Procedure G-Codes:     Binnie Kand M.S., OTR/L Pager: [phone]  12/19/2015, 2:31 PM

## 2015-12-19 NOTE — Progress Notes (Signed)
RN discussed discharge instructions with patient including f/u appointment with Dr. Donnetta Hutching, who states office visit will be coordinated with patient tomorrow, f/u appointment with PCP. Patient understands medication changes from asa to plavix, states he will pick up plavix at pharmacy and begin medication tomorrow morning. Neuro assessment unchanged. IV removed, tele removed. He is refusing assistance to transportation. Will continue to monitor.

## 2015-12-19 NOTE — Progress Notes (Signed)
PT Cancellation Note  Patient Details Name: Damon Foley MRN: [id number] DOB: [birth year]   Cancelled Treatment:    Reason Eval/Treat Not Completed: PT screened, no needs identified, will sign off -- pt has no obvious mobility deficits.  OT assessment reveals no problems with balance.  Pt educated on stroke prevention (remain active, control BP and CBG, follow medical recommendations), pt verbalized agreement.    [facility] 12/19/2015, 2:29 PM

## 2015-12-19 NOTE — Progress Notes (Addendum)
STROKE TEAM PROGRESS NOTE   HISTORY OF PRESENT ILLNESS (per record) Damon Foley is an 75 y.o. male with a history of hypothyroidism, hypertension, hyperlipidemia, alcohol abuse, and a carotid bruit who was admitted to [facility] today, 12/18/2015, for evaluation of left arm numbness. MRI today - acute nonhemorrhagic infarct within the posterior right frontal lobe along the inferior surface of the right frontal operculum with a remote lacunar infarct in the posterior right lentiform nucleus and external capsule. The patient states that he was not aware of any previous strokes. He takes aspirin 81 mg every day without fail.  Approximately 10-12 days ago the patient developed a visual disturbance in his right eye. He describes a horizontal line of blurriness, like looking through rain drops, with clear vision above and below this area. He saw his primary care physician who sent him to an ophthalmologist. He was scheduled for an MRI today for further evaluation. When he awoke at 6 AM he noticed that his left arm was numb. This caused some difficulty with coordination and control. The arm symptoms improved over the next several hours and he is now back to baseline. His vision has improved as well; however, he still has some mild blurriness. He was already scheduled for the MRI today so he came to [facility] for further evaluation.  Date last known well: Unable to determine Time last known well: Unable to determine tPA Given: No: The patient was out of the therapeutic time window for treatment with TPA.  SUBJECTIVE (INTERVAL HISTORY) No family is at the bedside.  Overall he feels his condition is completely resolved. He stated that about 2 weeks ago he had right eye upper visual field vision loss, gradually recovered to currently only has visual disturbance with blurred vision at middle horizontal line. He went to see ophthalmologist and was told there was artery occlusion in the  high. He had episode of left arm numbness yesterday lasting 3 hours. MRI showed right MCA embolic stroke. Carotid Doppler on 12/09/2015 was negative, however today's CTA of the neck showed right ICA 50% stenosis with bulk of the plaques at the bifurcation. Due to the 2 events are likely related to right ICA stenosis, will request  vascular surgery consultation.    OBJECTIVE Temp:  [98 F (36.7 C)-98.3 F (36.8 C)] 98.2 F (36.8 C) (09/10 0617) Pulse Rate:  [51-61] 56 (09/10 0617) Cardiac Rhythm: Sinus bradycardia;Heart block (09/09 1900) Resp:  [13-23] 16 (09/10 0617) BP: (115-155)/(58-83) 150/69 (09/10 0617) SpO2:  [95 %-100 %] 95 % (09/10 0617)  CBC:  Recent Labs Lab 12/14/15 1508 12/18/15 0745  WBC 4.9 5.1  NEUTROABS 3.2  --   HGB 16.1 15.3  HCT 45.8 42.5  MCV 93.3 89.7  PLT 160.0 126*    Basic Metabolic Panel:  Recent Labs Lab 12/14/15 1508 12/18/15 0745  NA 137 135  K 4.3 3.9  CL 104 105  CO2 27 24  GLUCOSE 107* 125*  BUN 18 23*  CREATININE 1.27 1.26*  CALCIUM 9.4 9.8    Lipid Panel:    Component Value Date/Time   CHOL 163 12/19/2015 0304   TRIG 213 (H) 12/19/2015 0304   HDL 35 (L) 12/19/2015 0304   CHOLHDL 4.7 12/19/2015 0304   VLDL 43 (H) 12/19/2015 0304   LDLCALC 85 12/19/2015 0304   HgbA1c:  Lab Results  Component Value Date   HGBA1C 5.4 12/14/2015   Urine Drug Screen:    Component Value Date/Time  LABOPIA NONE DETECTED 12/18/2015 0851   COCAINSCRNUR NONE DETECTED 12/18/2015 0851   LABBENZ NONE DETECTED 12/18/2015 0851   AMPHETMU NONE DETECTED 12/18/2015 0851   THCU NONE DETECTED 12/18/2015 0851   LABBARB NONE DETECTED 12/18/2015 0851      IMAGING I have personally reviewed the radiological images below and agree with the radiology interpretations.  Ct Head Wo Contrast  12/18/2015 IMPRESSION: 1. Small focal hypodensity in the region of the posterior limb of right external capsule could relate to age-indeterminate lacune. Of note, patient  is scheduled for MRI of the brain ; this area could be evaluated at the time of MRI. 2. Otherwise no acute intracranial abnormality. 3. Atherosclerotic vascular disease.   Mri Brain Wo Contrast 12/18/2015 IMPRESSION: 1. Acute nonhemorrhagic infarct within the posterior right frontal lobe along the inferior surface of the right frontal operculum. 2. Remote lacunar infarct in the posterior right lentiform nucleus and external capsule corresponds with the finding at CT. 3. No other acute intracranial abnormality.   Ct Angio Neck W Or Wo Contrast 12/19/2015 IMPRESSION: 1. Slightly greater than 50% stenosis of the right internal carotid artery at its origin with dense irregular calcifications along the medial wall. 2. Irregular calcifications in diffuse atherosclerotic disease in the proximal dominant left vertebral artery with suspected moderate proximal stenosis. Artifact exaggerates the appearance. 3. Atherosclerotic changes in the distal left common carotid artery and proximal left internal carotid artery without a significant stenosis relative to the more distal vessel. 4. Advanced degenerative changes within the cervical spine as described.   Mr Jodene Nam Head/brain Wo Cm 12/18/2015 IMPRESSION: Normal variant MRA circle of Willis without significant proximal stenosis, aneurysm, or branch vessel occlusion. No focal lesion to account for the acute infarct.   CUS - Minimal amount of bilateral atherosclerotic plaque, right greater than left, not resulting in a hemodynamically significant stenosis within either internal carotid artery.  TTE - - Left ventricle: The cavity size was normal. Wall thickness was   normal. Systolic function was vigorous. The estimated ejection   fraction was in the range of 65% to 70%. Wall motion was normal;   there were no regional wall motion abnormalities. Doppler   parameters are consistent with abnormal left ventricular   relaxation (grade 1 diastolic dysfunction). - Aortic  valve: There was mild regurgitation.   PHYSICAL EXAM Physical exam  Temp:  [98 F (36.7 C)-98.3 F (36.8 C)] 98.1 F (36.7 C) (09/10 1005) Pulse Rate:  [56-61] 59 (09/10 1005) Resp:  [15-18] 18 (09/10 1005) BP: (115-154)/(64-83) 133/66 (09/10 1005) SpO2:  [95 %-100 %] 96 % (09/10 1005)  General - Well nourished, well developed, in no apparent distress.  Ophthalmologic - Fundi not visualized due to small pupils.  Cardiovascular - Regular rate and rhythm.  Mental Status -  Level of arousal and orientation to time, place, and person were intact. Language including expression, naming, repetition, comprehension was assessed and found intact. Attention span and concentration were normal. Fund of Knowledge was assessed and was intact.  Cranial Nerves II - XII - II - Visual field intact OU. III, IV, VI - Extraocular movements intact. V - Facial sensation intact bilaterally. VII - Facial movement intact bilaterally. VIII - Hearing & vestibular intact bilaterally. X - Palate elevates symmetrically. XI - Chin turning & shoulder shrug intact bilaterally. XII - Tongue protrusion intact.  Motor Strength - The patient's strength was normal in all extremities and pronator drift was absent.  Bulk was normal and fasciculations were absent.  Motor Tone - Muscle tone was assessed at the neck and appendages and was normal.  Reflexes - The patient's reflexes were 1+ in all extremities and he had no pathological reflexes.  Sensory - Light touch, temperature/pinprick, vibration and proprioception, and Romberg testing were assessed and were symmetrical.    Coordination - The patient had normal movements in the hands and feet with no ataxia or dysmetria.  Tremor was absent.  Gait and Station - The patient's transfers, posture, gait, station, and turns were observed as normal.   ASSESSMENT/PLAN Mr. BAILEE MACKIN is a 75 y.o. male with history of hypertension, hyperlipidemia, BPH was  admitted on 12/18/2015 for left arm numbness lasting 3 hours as well as right visual disturbance for 2 weeks . He did not receive IV t-PA due to delayed on arrival.   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 Although right ICA stenosis just slightly > 50%, along with right amaurosis fugax 2 weeks PTA, would recommend vascular surgery consultation for consideration of right CEA.  Resultant  Left arm numbness resolved  MRI  Right MCA small cortical infarct  MRA  Unremarkable  CTA neck right ICA slightly >50% stenosis with bulk of calcified and unclassified plaques  carotid Doppler  12/09/2015 unremarkable   2D Echo  EF 65-70%  Recommend vascular surgery consultation for possible right CEA due to 2 recent events of symptomatic right ICA stenosis.  LDL 85   HgbA1c pending  Lovenox for VTE prophylaxis  Diet Heart Room service appropriate? Yes; Fluid consistency: Thin  aspirin 81 mg daily prior to admission, now on clopidogrel 75 mg daily  Patient counseled to be compliant with his antithrombotic medications  Ongoing aggressive stroke risk factor management  Therapy recommendations:  Pending   Disposition:  Pending   Amaurosis fugax, right  Right eye upper visual field amaurosis fugax 2 weeks ago  Ophthalmologist considered retinal artery occlusion as per patient  Vision gradually improved, however not 100% recovered  This is a event of symptomatic right ICA stenosis, recommend vascular surgery consultation for possible right CEA  Hypertension  Stable  Permissive hypertension (OK if < 220/120) but gradually normalize in 5-7 days  Long-term BP goal normotensive  Hyperlipidemia  Home meds:   Lipitor 40, resumed in hospital  LDL  85, goal < 70  Continue statin at discharge Other Stroke Risk Factors  Advanced age  Obesity, Body mass index is 30.68 kg/m., recommend weight loss, diet and exercise as appropriate   Other  Active Problems  Slight elevation of creatinine  Hospital day # 1  Rosalin Hawking, MD PhD Stroke Neurology 12/19/2015 2:02 PM      To contact Stroke Continuity provider, please refer to [email or web address] After hours, contact General Neurology

## 2015-12-20 ENCOUNTER — Telehealth: Payer: Self-pay | Admitting: Behavioral Health

## 2015-12-20 ENCOUNTER — Other Ambulatory Visit: Payer: Self-pay

## 2015-12-20 ENCOUNTER — Encounter: Payer: Self-pay | Admitting: Internal Medicine

## 2015-12-20 ENCOUNTER — Telehealth: Payer: Self-pay | Admitting: *Deleted

## 2015-12-20 ENCOUNTER — Telehealth: Payer: Self-pay | Admitting: Internal Medicine

## 2015-12-20 LAB — HEMOGLOBIN A1C
HEMOGLOBIN A1C: 5.6 % (ref 4.8–5.6)
Mean Plasma Glucose: 114 mg/dL

## 2015-12-20 NOTE — Telephone Encounter (Signed)
ECHO cancelled.

## 2015-12-20 NOTE — Telephone Encounter (Signed)
Called patient about possibly participating in [place] study. Patient states he has changed his mind and declines to participate because he is [place] months.

## 2015-12-20 NOTE — Telephone Encounter (Signed)
error:[id number] ° °

## 2015-12-20 NOTE — Telephone Encounter (Signed)
Yes, okay to cancel outpatient echo

## 2015-12-20 NOTE — Telephone Encounter (Signed)
°  Relation to [id number] Call back Blaine:  Reason for call:  Patient had ECHOCARDIOGRAM completed due to patient being seen in the ED patient has a scheduled ECHOCARDIOGRAM due to orders placed by PCP. Patient would like to know should he cancelled.please advise patient directly.

## 2015-12-20 NOTE — Telephone Encounter (Signed)
Patient declined hospital follow-up at this time. He voiced that more than likely this Thursday he will have a carotid endarterectomy with Dr. Luther Parody office (vascular surgery), which is a procedure that was recommended to him in the hospital. Also, reporting that he was informed that his right carotid artery has 50% blockage and with him just having another incident two weeks prior to this one, they are wanting this procedure to be completed as soon as possible. Patient expressed that instead of coming in this week to see his PCP, he will just wait to next week since he already had an appointment scheduled (12/31/15 at 10:45 AM) for a 6 month follow-up before this last hospitalization.

## 2015-12-20 NOTE — Telephone Encounter (Signed)
Please advise 

## 2015-12-21 ENCOUNTER — Encounter: Payer: Self-pay | Admitting: *Deleted

## 2015-12-21 NOTE — Progress Notes (Signed)
Per Dr. Donnetta Hutching, Patient is symptomatic and should not stop PLAVIX prior to surgery on 12-23-15 ( Right CEA)

## 2015-12-21 NOTE — Pre-Procedure Instructions (Signed)
Damon Foley  12/21/2015      [facility] [place], [address] [place] [address] Phone: [phone] Fax: [phone]  [facility] [phone] [address], [place] [address] [address] Phone: [phone] Fax: [phone]    Your procedure is scheduled on Thursday, December 23, 2015  Report to [facility] Admitting at 5:30 A.M.  Call this number if you have problems the morning of surgery:  [phone]   Remember:  Do not eat food or drink liquids after midnight.  Take these medicines the morning of surgery with A SIP OF WATER :Levothyroxine (SYNTHROID, Clopidogrel (PLAVIX), Atenolol ( Tenormin), Silodosin ( Rapaflo) Stop taking vitamins, fish oil and herbal medications. Do not take any NSAIDs ie: Ibuprofen, Advil, Naproxen, BC and Goody Powder and Meloxicam ( Mobic); stop now.  Do not wear jewelry, make-up or nail polish.  Do not wear lotions, powders, or perfumes, or deoderant.  Do not shave 48 hours prior to surgery.  Men may shave face and neck.  Do not bring valuables to the hospital.  [facility] is not responsible for any belongings or valuables.  Contacts, dentures or bridgework may not be worn into surgery.  Leave your suitcase in the car.  After surgery it may be brought to your room.  For patients admitted to the hospital, discharge time will be determined by your treatment team.  Patients discharged the day of surgery will not be allowed to drive home.   Name and phone number of your driver:   Special instructions:   [place] - Preparing for Surgery  Before surgery, you can play an important role.  Because skin is not sterile, your skin needs to be as free of germs as possible.  You can reduce the number of germs on you skin by washing with CHG (chlorahexidine gluconate) soap before surgery.  CHG  is an antiseptic cleaner which kills germs and bonds with the skin to continue killing germs even after washing.  Please DO NOT use if you have an allergy to CHG or antibacterial soaps.  If your skin becomes reddened/irritated stop using the CHG and inform your nurse when you arrive at Short Stay.  Do not shave (including legs and underarms) for at least 48 hours prior to the first CHG shower.  You may shave your face.  Please follow these instructions carefully:   1.  Shower with CHG Soap the night before surgery and the morning of Surgery.  2.  If you choose to wash your hair, wash your hair first as usual with your normal shampoo.  3.  After you shampoo, rinse your hair and body thoroughly to remove the Shampoo.  4.  Use CHG as you would any other liquid soap.  You can apply chg directly  to the skin and wash gently with scrungie or a clean washcloth.  5.  Apply the CHG Soap to your body ONLY FROM THE NECK DOWN.  Do not use on open wounds or open sores.  Avoid contact with your eyes, ears, mouth and genitals (private parts).  Wash genitals (private parts) with your normal soap.  6.  Wash thoroughly, paying special attention to the area where your surgery will be performed.  7.  Thoroughly rinse your body with warm water from the neck down.  8.  DO NOT shower/wash with your normal soap after using and rinsing off the CHG Soap.  9.  Pat yourself dry with a clean towel.            10.  Wear clean pajamas.            11.  Place clean sheets on your bed the night of your first shower and do not sleep with pets.  Day of Surgery  Do not apply any lotions/deodorants the morning of surgery.  Please wear clean clothes to the hospital/surgery center.  Please read over the following fact sheets that you were given. Pain Booklet, Coughing and Deep Breathing, Blood Transfusion Information, MRSA Information and Surgical Site Infection Prevention

## 2015-12-22 ENCOUNTER — Encounter (HOSPITAL_COMMUNITY)
Admission: RE | Admit: 2015-12-22 | Discharge: 2015-12-22 | Disposition: A | Discharge status: Home / Self Care | Payer: Medicare Other | Source: Ambulatory Visit | Attending: Vascular Surgery | Admitting: Vascular Surgery

## 2015-12-22 ENCOUNTER — Other Ambulatory Visit (HOSPITAL_BASED_OUTPATIENT_CLINIC_OR_DEPARTMENT_OTHER): Payer: TRICARE For Life (TFL)

## 2015-12-22 ENCOUNTER — Encounter (HOSPITAL_COMMUNITY): Payer: Self-pay

## 2015-12-22 HISTORY — DX: Headache: R51

## 2015-12-22 HISTORY — DX: Unspecified osteoarthritis, unspecified site: M19.90

## 2015-12-22 HISTORY — DX: Presence of spectacles and contact lenses: Z97.3

## 2015-12-22 HISTORY — DX: Unspecified cataract: H26.9

## 2015-12-22 HISTORY — DX: Cerebral infarction, unspecified: I63.9

## 2015-12-22 HISTORY — DX: Occlusion and stenosis of unspecified carotid artery: I65.29

## 2015-12-22 HISTORY — DX: Headache, unspecified: R51.9

## 2015-12-22 LAB — TYPE AND SCREEN
ABO/RH(D): O POS
Antibody Screen: NEGATIVE

## 2015-12-22 LAB — ABO/RH: ABO/RH(D): O POS

## 2015-12-22 LAB — PROTIME-INR
INR: 1.03
PROTHROMBIN TIME: 13.5 s (ref 11.4–15.2)

## 2015-12-22 LAB — SURGICAL PCR SCREEN
MRSA, PCR: NEGATIVE
Staphylococcus aureus: POSITIVE — AB

## 2015-12-22 LAB — APTT: APTT: 29 s (ref 24–36)

## 2015-12-22 NOTE — Progress Notes (Signed)
Anesthesia Chart Review: Patient is a 75 year old male scheduled for right carotid endarterectomy on 12/23/15 by Dr. Scot Dock. He was admitted 12/18/15-12/19/15 for LUE weakness and numbness with recent right eye amaurosis fugax. MRI showed acute nonhemorrhagic posterior right frontal lobe infarct with remote lacunar infarct. CTA neck showed moderate right carotid stenosis with an extremely irregular plaque at the bifurcation. Neurology was consulted. They recommended vascular surgery consultation due to symptomatic right ICA stenosis. Dr. Donnetta Hutching saw patient and recommended right CEA. He offered to do surgery while patient was still hospitalized, but patient declined due to other obligations. He was started on Plavix. After discharge, he decided to proceed with surgery.   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 PCP is Dr. Larose Kells.  Meds include atenolol, Lipitor, Plavix, levothyroxine, lisinopril, silodosin. Patient to continue Plavix perioperatively.  BP 135/74   Pulse 61   Temp 36.7 C   Resp 18   Ht 5\' 11"  (1.803 m)   Wt 222 lb 8 oz (100.9 kg)   SpO2 99%   BMI 31.03 kg/m   12/18/15 EKG: SR, first degree AV block, LAD, consider LAFB, anterior infarct (old). Overall stable since 03/10/13.  12/19/15 Echo: Study Conclusions - Left ventricle: The cavity size was normal. Wall thickness was   normal. Systolic function was vigorous. The estimated ejection   fraction was in the range of 65% to 70%. Wall motion was normal;   there were no regional wall motion abnormalities. Doppler   parameters are consistent with abnormal left ventricular   relaxation (grade 1 diastolic dysfunction). - Aortic valve: There was mild regurgitation.  12/18/15 MRA/MRI Brain/head: IMPRESSION (MRI): 1. Acute  nonhemorrhagic infarct within the posterior right frontal lobe along the inferior surface of the right frontal operculum. 2. Remote lacunar infarct in the posterior right lentiform nucleus and external capsule corresponds with the finding at CT. 3. No other acute intracranial abnormality. IMPRESSION (MRA): Normal variant MRA circle of Willis without significant proximal stenosis, aneurysm, or branch vessel occlusion. No focal lesion to account for the acute infarct.  12/19/15 CTA neck: IMPRESSION: 1. Slightly greater than 50% stenosis of the right internal carotid artery at its origin with dense irregular calcifications along the medial wall. 2. Irregular calcifications in diffuse atherosclerotic disease in the proximal dominant left vertebral artery with suspected moderate proximal stenosis. Artifact exaggerates the appearance. 3. Atherosclerotic changes in the distal left common carotid artery and proximal left internal carotid artery without a significant stenosis relative to the more distal vessel. 4. Advanced degenerative changes within the cervical spine as described.  12/09/15 Carotid U/S: IMPRESSION: Minimal amount of bilateral atherosclerotic plaque, right greater than left, not resulting in a hemodynamically significant stenosis within either internal carotid artery.  Labs from 12/14/15-12/19/15 noted. Cr 1.26, A1c 5.6, H/H 15.3/42.5, PLT 126. UA, AST/ALT WNL. Today PT/PTT were WNL.   Patient with symptomatic right ICA stenosis. If no acute changes then I would anticipate that he could proceed as planned.  [facility] Short Stay Center/Anesthesiology Phone [phone] 12/22/2015 12:03 PM

## 2015-12-22 NOTE — Progress Notes (Signed)
Pt denies SOB, chest pain, and being under the care of a cardiologist. Pt denies having a stress test and cardiac cath. Pt denies having a chest x ray within the last year. Spoke with Arbie Cookey, RN, okay to use labs from 12/18/15. Anesthesia reviewed pt history (see note).

## 2015-12-23 ENCOUNTER — Encounter (HOSPITAL_COMMUNITY)
Admission: RE | Disposition: A | Discharge status: Home / Self Care | Payer: Self-pay | Source: Ambulatory Visit | Attending: Vascular Surgery

## 2015-12-23 ENCOUNTER — Inpatient Hospital Stay (HOSPITAL_COMMUNITY): Payer: Medicare Other | Admitting: Certified Registered Nurse Anesthetist

## 2015-12-23 ENCOUNTER — Encounter (HOSPITAL_COMMUNITY): Payer: Self-pay | Admitting: *Deleted

## 2015-12-23 ENCOUNTER — Inpatient Hospital Stay (HOSPITAL_COMMUNITY): Payer: Medicare Other | Admitting: Vascular Surgery

## 2015-12-23 ENCOUNTER — Inpatient Hospital Stay (HOSPITAL_COMMUNITY)
Admission: RE | Admit: 2015-12-23 | Discharge: 2015-12-24 | DRG: 039 | Disposition: A | Discharge status: Home / Self Care | Payer: Medicare Other | Source: Ambulatory Visit | Attending: Vascular Surgery | Admitting: Vascular Surgery

## 2015-12-23 DIAGNOSIS — Z85828 Personal history of other malignant neoplasm of skin: Secondary | ICD-10-CM

## 2015-12-23 DIAGNOSIS — E785 Hyperlipidemia, unspecified: Secondary | ICD-10-CM | POA: Diagnosis present

## 2015-12-23 DIAGNOSIS — I739 Peripheral vascular disease, unspecified: Secondary | ICD-10-CM | POA: Diagnosis present

## 2015-12-23 DIAGNOSIS — K219 Gastro-esophageal reflux disease without esophagitis: Secondary | ICD-10-CM | POA: Diagnosis not present

## 2015-12-23 DIAGNOSIS — M1990 Unspecified osteoarthritis, unspecified site: Secondary | ICD-10-CM | POA: Diagnosis not present

## 2015-12-23 DIAGNOSIS — Z8673 Personal history of transient ischemic attack (TIA), and cerebral infarction without residual deficits: Secondary | ICD-10-CM | POA: Diagnosis not present

## 2015-12-23 DIAGNOSIS — E209 Hypoparathyroidism, unspecified: Secondary | ICD-10-CM | POA: Diagnosis present

## 2015-12-23 DIAGNOSIS — I6529 Occlusion and stenosis of unspecified carotid artery: Secondary | ICD-10-CM | POA: Diagnosis present

## 2015-12-23 DIAGNOSIS — I6521 Occlusion and stenosis of right carotid artery: Principal | ICD-10-CM | POA: Diagnosis present

## 2015-12-23 DIAGNOSIS — H3400 Transient retinal artery occlusion, unspecified eye: Secondary | ICD-10-CM | POA: Diagnosis not present

## 2015-12-23 DIAGNOSIS — Z87891 Personal history of nicotine dependence: Secondary | ICD-10-CM | POA: Diagnosis not present

## 2015-12-23 DIAGNOSIS — I10 Essential (primary) hypertension: Secondary | ICD-10-CM | POA: Diagnosis not present

## 2015-12-23 HISTORY — PX: PATCH ANGIOPLASTY: SHX6230

## 2015-12-23 HISTORY — PX: ENDARTERECTOMY: SHX5162

## 2015-12-23 SURGERY — ENDARTERECTOMY, CAROTID
Anesthesia: General | Site: Neck | Laterality: Right

## 2015-12-23 MED ORDER — DEXTROSE 5 % IV SOLN
1.5000 g | Freq: Two times a day (BID) | INTRAVENOUS | Status: AC
  Administered 2015-12-23 – 2015-12-24 (×2): 1.5 g via INTRAVENOUS
  Filled 2015-12-23 (×4): qty 1.5

## 2015-12-23 MED ORDER — LEVOTHYROXINE SODIUM 75 MCG PO TABS
150.0000 ug | ORAL_TABLET | Freq: Every day | ORAL | Status: DC
  Administered 2015-12-24: 150 ug via ORAL
  Filled 2015-12-23: qty 2

## 2015-12-23 MED ORDER — PROPOFOL 10 MG/ML IV BOLUS
INTRAVENOUS | Status: AC
  Filled 2015-12-23: qty 40

## 2015-12-23 MED ORDER — LABETALOL HCL 5 MG/ML IV SOLN
10.0000 mg | INTRAVENOUS | Status: DC | PRN
  Filled 2015-12-23: qty 4

## 2015-12-23 MED ORDER — LISINOPRIL 40 MG PO TABS
40.0000 mg | ORAL_TABLET | Freq: Every day | ORAL | Status: DC
  Administered 2015-12-23 – 2015-12-24 (×2): 40 mg via ORAL
  Filled 2015-12-23 (×2): qty 1

## 2015-12-23 MED ORDER — GLYCOPYRROLATE 0.2 MG/ML IJ SOLN
INTRAMUSCULAR | Status: DC | PRN
  Administered 2015-12-23 (×2): 0.1 mg via INTRAVENOUS

## 2015-12-23 MED ORDER — ACETAMINOPHEN 325 MG PO TABS: 325.0000 mg | ORAL_TABLET | ORAL | Status: DC | PRN

## 2015-12-23 MED ORDER — LIDOCAINE-EPINEPHRINE (PF) 1 %-1:200000 IJ SOLN
INTRAMUSCULAR | Status: DC | PRN
  Administered 2015-12-23: 30 mL

## 2015-12-23 MED ORDER — SENNOSIDES-DOCUSATE SODIUM 8.6-50 MG PO TABS: 1.0000 | ORAL_TABLET | Freq: Every evening | ORAL | Status: DC | PRN

## 2015-12-23 MED ORDER — DEXTROSE 5 % IV SOLN
INTRAVENOUS | Status: DC | PRN
  Administered 2015-12-23: 5 ug/min via INTRAVENOUS

## 2015-12-23 MED ORDER — OXYCODONE HCL 5 MG PO TABS: 5.0000 mg | ORAL_TABLET | Freq: Once | ORAL | Status: DC | PRN

## 2015-12-23 MED ORDER — ONDANSETRON HCL 4 MG/2ML IJ SOLN
INTRAMUSCULAR | Status: DC | PRN
  Administered 2015-12-23: 4 mg via INTRAVENOUS

## 2015-12-23 MED ORDER — METOPROLOL TARTRATE 5 MG/5ML IV SOLN: 2.0000 mg | INTRAVENOUS | Status: DC | PRN

## 2015-12-23 MED ORDER — ONDANSETRON HCL 4 MG/2ML IJ SOLN
INTRAMUSCULAR | Status: AC
  Filled 2015-12-23: qty 2

## 2015-12-23 MED ORDER — DEXAMETHASONE SODIUM PHOSPHATE 10 MG/ML IJ SOLN
INTRAMUSCULAR | Status: AC
  Filled 2015-12-23: qty 1

## 2015-12-23 MED ORDER — CHLORHEXIDINE GLUCONATE CLOTH 2 % EX PADS: 6.0000 | MEDICATED_PAD | Freq: Once | CUTANEOUS | Status: DC

## 2015-12-23 MED ORDER — LIDOCAINE HCL (PF) 1 % IJ SOLN
INTRAMUSCULAR | Status: AC
  Filled 2015-12-23: qty 30

## 2015-12-23 MED ORDER — OXYCODONE-ACETAMINOPHEN 5-325 MG PO TABS: 1.0000 | ORAL_TABLET | ORAL | Status: DC | PRN

## 2015-12-23 MED ORDER — POTASSIUM CHLORIDE CRYS ER 20 MEQ PO TBCR: 20.0000 meq | EXTENDED_RELEASE_TABLET | Freq: Every day | ORAL | Status: DC | PRN

## 2015-12-23 MED ORDER — HEPARIN SODIUM (PORCINE) 1000 UNIT/ML IJ SOLN
INTRAMUSCULAR | Status: DC | PRN
  Administered 2015-12-23: 10000 [IU] via INTRAVENOUS

## 2015-12-23 MED ORDER — ACETAMINOPHEN 160 MG/5ML PO SOLN
325.0000 mg | ORAL | Status: DC | PRN
  Filled 2015-12-23: qty 20.3

## 2015-12-23 MED ORDER — PHENYLEPHRINE 40 MCG/ML (10ML) SYRINGE FOR IV PUSH (FOR BLOOD PRESSURE SUPPORT)
PREFILLED_SYRINGE | INTRAVENOUS | Status: AC
  Filled 2015-12-23: qty 10

## 2015-12-23 MED ORDER — 0.9 % SODIUM CHLORIDE (POUR BTL) OPTIME
TOPICAL | Status: DC | PRN
  Administered 2015-12-23: 2000 mL

## 2015-12-23 MED ORDER — DOCUSATE SODIUM 100 MG PO CAPS
100.0000 mg | ORAL_CAPSULE | Freq: Every day | ORAL | Status: DC
  Administered 2015-12-24: 100 mg via ORAL
  Filled 2015-12-23: qty 1

## 2015-12-23 MED ORDER — MAGNESIUM SULFATE 2 GM/50ML IV SOLN: 2.0000 g | Freq: Every day | INTRAVENOUS | Status: DC | PRN

## 2015-12-23 MED ORDER — PANTOPRAZOLE SODIUM 40 MG PO TBEC
40.0000 mg | DELAYED_RELEASE_TABLET | Freq: Every day | ORAL | Status: DC
  Administered 2015-12-23 – 2015-12-24 (×2): 40 mg via ORAL
  Filled 2015-12-23 (×2): qty 1

## 2015-12-23 MED ORDER — SODIUM CHLORIDE 0.9 % IV SOLN: INTRAVENOUS | Status: DC

## 2015-12-23 MED ORDER — LACTATED RINGERS IV SOLN
INTRAVENOUS | Status: DC | PRN
  Administered 2015-12-23 (×2): via INTRAVENOUS

## 2015-12-23 MED ORDER — SODIUM CHLORIDE 0.9 % IV SOLN
0.0125 ug/kg/min | INTRAVENOUS | Status: AC
  Administered 2015-12-23: 09:00:00 via INTRAVENOUS
  Administered 2015-12-23: .2 ug/kg/min via INTRAVENOUS
  Filled 2015-12-23: qty 2000

## 2015-12-23 MED ORDER — TAMSULOSIN HCL 0.4 MG PO CAPS: 0.4000 mg | ORAL_CAPSULE | Freq: Every day | ORAL | Status: DC

## 2015-12-23 MED ORDER — DEXTROSE 5 % IV SOLN
1.5000 g | INTRAVENOUS | Status: AC
  Administered 2015-12-23: 1.5 g via INTRAVENOUS
  Filled 2015-12-23: qty 1.5

## 2015-12-23 MED ORDER — DEXAMETHASONE SODIUM PHOSPHATE 10 MG/ML IJ SOLN
INTRAMUSCULAR | Status: DC | PRN
  Administered 2015-12-23: 10 mg via INTRAVENOUS

## 2015-12-23 MED ORDER — FENTANYL CITRATE (PF) 100 MCG/2ML IJ SOLN
INTRAMUSCULAR | Status: AC
  Filled 2015-12-23: qty 4

## 2015-12-23 MED ORDER — ALUM & MAG HYDROXIDE-SIMETH 200-200-20 MG/5ML PO SUSP: 15.0000 mL | ORAL | Status: DC | PRN

## 2015-12-23 MED ORDER — HEPARIN SODIUM (PORCINE) 1000 UNIT/ML IJ SOLN
INTRAMUSCULAR | Status: AC
  Filled 2015-12-23: qty 1

## 2015-12-23 MED ORDER — PHENOL 1.4 % MT LIQD: 1.0000 | OROMUCOSAL | Status: DC | PRN

## 2015-12-23 MED ORDER — LIDOCAINE-EPINEPHRINE (PF) 1 %-1:200000 IJ SOLN
INTRAMUSCULAR | Status: AC
  Filled 2015-12-23: qty 30

## 2015-12-23 MED ORDER — OXYCODONE HCL 5 MG/5ML PO SOLN: 5.0000 mg | Freq: Once | ORAL | Status: DC | PRN

## 2015-12-23 MED ORDER — SODIUM CHLORIDE 0.9 % IV SOLN
INTRAVENOUS | Status: DC | PRN
  Administered 2015-12-23: 08:00:00

## 2015-12-23 MED ORDER — ACETAMINOPHEN 325 MG RE SUPP: 325.0000 mg | RECTAL | Status: DC | PRN

## 2015-12-23 MED ORDER — DEXTRAN 40 IN SALINE 10-0.9 % IV SOLN
INTRAVENOUS | Status: DC | PRN
  Administered 2015-12-23: 500 mL

## 2015-12-23 MED ORDER — SODIUM CHLORIDE 0.9 % IV SOLN: 500.0000 mL | Freq: Once | INTRAVENOUS | Status: DC | PRN

## 2015-12-23 MED ORDER — SUGAMMADEX SODIUM 200 MG/2ML IV SOLN
INTRAVENOUS | Status: AC
Start: 2015-12-23 — End: ?
  Filled 2015-12-23: qty 2

## 2015-12-23 MED ORDER — HYDRALAZINE HCL 20 MG/ML IJ SOLN
5.0000 mg | INTRAMUSCULAR | Status: DC | PRN
  Filled 2015-12-23: qty 0.25

## 2015-12-23 MED ORDER — ROCURONIUM BROMIDE 10 MG/ML (PF) SYRINGE
PREFILLED_SYRINGE | INTRAVENOUS | Status: DC | PRN
  Administered 2015-12-23: 60 mg via INTRAVENOUS

## 2015-12-23 MED ORDER — ONDANSETRON HCL 4 MG/2ML IJ SOLN: 4.0000 mg | Freq: Four times a day (QID) | INTRAMUSCULAR | Status: DC | PRN

## 2015-12-23 MED ORDER — FENTANYL CITRATE (PF) 100 MCG/2ML IJ SOLN
INTRAMUSCULAR | Status: DC | PRN
  Administered 2015-12-23 (×2): 50 ug via INTRAVENOUS

## 2015-12-23 MED ORDER — MORPHINE SULFATE (PF) 2 MG/ML IV SOLN: 2.0000 mg | INTRAVENOUS | Status: DC | PRN

## 2015-12-23 MED ORDER — SODIUM CHLORIDE 0.9 % IV SOLN
0.0125 ug/kg/min | INTRAVENOUS | Status: DC
  Filled 2015-12-23: qty 2000

## 2015-12-23 MED ORDER — PROTAMINE SULFATE 10 MG/ML IV SOLN
INTRAVENOUS | Status: DC | PRN
  Administered 2015-12-23: 40 mg via INTRAVENOUS

## 2015-12-23 MED ORDER — FENTANYL CITRATE (PF) 100 MCG/2ML IJ SOLN: 25.0000 ug | INTRAMUSCULAR | Status: DC | PRN

## 2015-12-23 MED ORDER — PROPOFOL 10 MG/ML IV BOLUS
INTRAVENOUS | Status: DC | PRN
  Administered 2015-12-23 (×2): 50 mg via INTRAVENOUS

## 2015-12-23 MED ORDER — ASPIRIN EC 325 MG PO TBEC
325.0000 mg | DELAYED_RELEASE_TABLET | Freq: Every day | ORAL | Status: DC
Start: 2015-12-23 — End: 2015-12-24
  Administered 2015-12-23: 325 mg via ORAL
  Filled 2015-12-23: qty 1

## 2015-12-23 MED ORDER — SODIUM CHLORIDE 0.9 % IV SOLN
INTRAVENOUS | Status: DC
  Administered 2015-12-23: 19:00:00 via INTRAVENOUS

## 2015-12-23 MED ORDER — ENOXAPARIN SODIUM 40 MG/0.4ML ~~LOC~~ SOLN
40.0000 mg | SUBCUTANEOUS | Status: DC
  Administered 2015-12-24: 40 mg via SUBCUTANEOUS
  Filled 2015-12-23: qty 0.4

## 2015-12-23 MED ORDER — CLOPIDOGREL BISULFATE 75 MG PO TABS
75.0000 mg | ORAL_TABLET | Freq: Every day | ORAL | Status: DC
  Administered 2015-12-24: 75 mg via ORAL
  Filled 2015-12-23: qty 1

## 2015-12-23 MED ORDER — ROCURONIUM BROMIDE 10 MG/ML (PF) SYRINGE
PREFILLED_SYRINGE | INTRAVENOUS | Status: AC
  Filled 2015-12-23: qty 10

## 2015-12-23 MED ORDER — PHENYLEPHRINE HCL 10 MG/ML IJ SOLN
INTRAMUSCULAR | Status: DC | PRN
  Administered 2015-12-23 (×3): 40 ug via INTRAVENOUS
  Administered 2015-12-23: 80 ug via INTRAVENOUS
  Administered 2015-12-23: 40 ug via INTRAVENOUS
  Administered 2015-12-23 (×2): 80 ug via INTRAVENOUS

## 2015-12-23 MED ORDER — EPHEDRINE 5 MG/ML INJ
INTRAVENOUS | Status: AC
  Filled 2015-12-23: qty 10

## 2015-12-23 MED ORDER — DEXTRAN 40 IN SALINE 10-0.9 % IV SOLN
INTRAVENOUS | Status: AC
  Filled 2015-12-23: qty 500

## 2015-12-23 MED ORDER — GLYCOPYRROLATE 0.2 MG/ML IV SOSY
PREFILLED_SYRINGE | INTRAVENOUS | Status: AC
  Filled 2015-12-23: qty 3

## 2015-12-23 MED ORDER — ATORVASTATIN CALCIUM 40 MG PO TABS
40.0000 mg | ORAL_TABLET | Freq: Every day | ORAL | Status: DC
  Administered 2015-12-23: 40 mg via ORAL
  Filled 2015-12-23: qty 1

## 2015-12-23 MED ORDER — GUAIFENESIN-DM 100-10 MG/5ML PO SYRP: 15.0000 mL | ORAL_SOLUTION | ORAL | Status: DC | PRN

## 2015-12-23 MED ORDER — ATENOLOL 50 MG PO TABS
50.0000 mg | ORAL_TABLET | Freq: Every day | ORAL | Status: DC
  Administered 2015-12-24: 50 mg via ORAL
  Filled 2015-12-23: qty 1

## 2015-12-23 MED ORDER — BISACODYL 5 MG PO TBEC: 5.0000 mg | DELAYED_RELEASE_TABLET | Freq: Every day | ORAL | Status: DC | PRN

## 2015-12-23 SURGICAL SUPPLY — 40 items
BAG DECANTER FOR FLEXI CONT (MISCELLANEOUS) ×3 IMPLANT
CANISTER SUCTION 2500CC (MISCELLANEOUS) ×3 IMPLANT
CANNULA VESSEL 3MM 2 BLNT TIP (CANNULA) ×7 IMPLANT
CATH ROBINSON RED A/P 18FR (CATHETERS) ×3 IMPLANT
CLIP TI MEDIUM 24 (CLIP) ×3 IMPLANT
CLIP TI WIDE RED SMALL 24 (CLIP) ×3 IMPLANT
CRADLE DONUT ADULT HEAD (MISCELLANEOUS) ×3 IMPLANT
DRAIN CHANNEL 15F RND FF W/TCR (WOUND CARE) IMPLANT
ELECT REM PT RETURN 9FT ADLT (ELECTROSURGICAL) ×3
ELECTRODE REM PT RTRN 9FT ADLT (ELECTROSURGICAL) ×1 IMPLANT
EVACUATOR SILICONE 100CC (DRAIN) IMPLANT
GLOVE BIO SURGEON STRL SZ 6 (GLOVE) ×2 IMPLANT
GLOVE BIO SURGEON STRL SZ7.5 (GLOVE) ×3 IMPLANT
GLOVE BIOGEL PI IND STRL 6.5 (GLOVE) IMPLANT
GLOVE BIOGEL PI IND STRL 7.0 (GLOVE) IMPLANT
GLOVE BIOGEL PI IND STRL 8 (GLOVE) ×1 IMPLANT
GLOVE BIOGEL PI INDICATOR 6.5 (GLOVE) ×4
GLOVE BIOGEL PI INDICATOR 7.0 (GLOVE) ×2
GLOVE BIOGEL PI INDICATOR 8 (GLOVE) ×2
GLOVE ECLIPSE 6.5 STRL STRAW (GLOVE) ×2 IMPLANT
GOWN STRL REUS W/ TWL LRG LVL3 (GOWN DISPOSABLE) ×3 IMPLANT
GOWN STRL REUS W/TWL LRG LVL3 (GOWN DISPOSABLE) ×12
KIT BASIN OR (CUSTOM PROCEDURE TRAY) ×3 IMPLANT
KIT ROOM TURNOVER OR (KITS) ×3 IMPLANT
LIQUID BAND (GAUZE/BANDAGES/DRESSINGS) ×3 IMPLANT
NDL HYPO 25X1 1.5 SAFETY (NEEDLE) ×1 IMPLANT
NEEDLE HYPO 25X1 1.5 SAFETY (NEEDLE) ×3 IMPLANT
NS IRRIG 1000ML POUR BTL (IV SOLUTION) ×6 IMPLANT
PACK CAROTID (CUSTOM PROCEDURE TRAY) ×3 IMPLANT
PAD ARMBOARD 7.5X6 YLW CONV (MISCELLANEOUS) ×6 IMPLANT
PATCH VASC XENOSURE 1CMX6CM (Vascular Products) ×3 IMPLANT
PATCH VASC XENOSURE 1X6 (Vascular Products) IMPLANT
SHUNT CAROTID BYPASS 10 (VASCULAR PRODUCTS) ×2 IMPLANT
SUT PROLENE 6 0 BV (SUTURE) ×5 IMPLANT
SUT SILK 2 0 FS (SUTURE) IMPLANT
SUT VIC AB 3-0 SH 27 (SUTURE) ×3
SUT VIC AB 3-0 SH 27X BRD (SUTURE) ×1 IMPLANT
SUT VICRYL 4-0 PS2 18IN ABS (SUTURE) ×3 IMPLANT
SYR CONTROL 10ML LL (SYRINGE) ×3 IMPLANT
WATER STERILE IRR 1000ML POUR (IV SOLUTION) ×3 IMPLANT

## 2015-12-23 NOTE — Progress Notes (Signed)
PT C/O NEEDING TO VOID AND BEING VERY UNCOMFORTABLE AND FEELING FULL BLADDER SCAN DONE WITH 999CC NOTED ON SCANNER I/O DONE WITHOUT DIFFUCULTY X 1 ATTEMPT WITH 1000CC OBTAINED ORDER OBTAINED BY DR Scot Dock

## 2015-12-23 NOTE — Op Note (Signed)
    NAME: Damon Foley   MRN: [id number] DOB: [birth year]    DATE OF OPERATION: 12/23/2015  PREOP DIAGNOSIS: symptomatic right carotid stenosis  POSTOP DIAGNOSIS: same  PROCEDURE: Right carotid endarterectomy with bovine pericardial patch angioplasty  SURGEON: Judeth Cornfield. Scot Dock, MD, FACS  ASSIST: Gerri Lins PA  ANESTHESIA: Gen.   EBL: inimal  INDICATIONS: Damon Foley is a 75 y.o. male who was having episodes of right eye amaurosis fugax.  He was found to have a focal calcific plaque. Right carotid endarterectomy was recommended in order to lower his risk of future stroke.  FINDINGS: Calcific focal stenosis of the right internal carotid artery. Of note, he had abnormal anatomy with the internal carotid artery rotated completely posteriorly and slightly medial making the dissection difficult.  TECHNIQUE: The patient was taken to the operative room after an arterial line was placed by anesthesia. He received a general anesthetic. After careful positioning, the right neck was prepped and draped in usual sterile fashion. An incision was made along the anterior border of the sternocleidomastoid and the dissection carried down to the common carotid artery which was dissected free and controlled with a Rummel tourniquet. The facial vein was divided between 2-0 silk ties.  The internal carotid artery was rotated completely posteriorly and medially.. I felt that the safest approach would be to address this from lateral approach. The external carotid artery was controlled with a vessel loop and retracted medially. The dissection was carried up to the internal carotid artery above the plaque and this was controlled with a vessel loop. The patient was heparinized.  Clamps were then placed on the internal and the common and the external carotid artery. A longitudinal arteriotomy was made in the common carotid artery and this was extended through the plaque into the internal carotid  artery. A 10 shunt was placed into the internal carotid artery, backbled and then placed into the common carotid artery and secured with Rummel tourniquet. Flow was reestablished to the shunt. Flow within the shunt was checked with the Doppler. An endarterectomy plane was established proximally and the plaque was sharply divided. Eversion endarterectomy was performed of the external carotid artery. Distally gives a nice taper and the plaque and no tacking sutures were required. The artery was irrigated with copious amount of heparin and dextran back:all 549 loose debris removed/  A bovine pericardial patch was cut to the appropriate size and sewn with continuous 6-0 Prolene suture. Prior to completing the patch closure, the shunt was removed. The arteries were backbled and flushed appropriately and the anastomosis completed. Flow was reestablished first to the external carotid artery and into the internal carotid artery. There was an excellent Doppler signal on the patch with good diastolic flow at completion. The heparin was partially reversed with protamine. Hemostasis was obtained in the wound. The deep layer was closed with running 3-0 Vicryl. The platysma was closed with running 3-0 Vicryl. The skin was closed 4-0 Vicryl. The patient awoke neurologically intact. All needle and sponge counts were correct. The patient was tolerated tolerated the procedure well and was transferred to the recovery room in stable condition.  Deitra Mayo, MD, FACS [facility]  DATE OF DICTATION:   12/23/2015

## 2015-12-23 NOTE — Transfer of Care (Signed)
Immediate Anesthesia Transfer of Care Note  Patient: Damon Foley  Procedure(s) Performed: Procedure(s): ENDARTERECTOMY CAROTID RIGHT (Right) PATCH ANGIOPLASTY RIGHT CAROTID USING XENOSURE BIOLOGIC PATCH (Right)  Patient Location: PACU  Anesthesia Type:General  Level of Consciousness: awake, alert , oriented and patient cooperative  Airway & Oxygen Therapy: Patient Spontanous Breathing and Patient connected to nasal cannula oxygen  Post-op Assessment: Report given to RN, Post -op Vital signs reviewed and stable and Patient moving all extremities X 4  Post vital signs: Reviewed and stable  Last Vitals:  Vitals:   12/23/15 0551  BP: (!) 151/78  Pulse: 61  Resp: 18  Temp: 36.8 C    Last Pain:  Vitals:   12/23/15 0551  TempSrc: Oral         Complications: No apparent anesthesia complications

## 2015-12-23 NOTE — H&P (View-Only) (Signed)
Vascular and Vein Specialist of [facility]  Patient name: LAI VENUS MRN: [id number] DOB: [birth year] Sex: male  REASON FOR CONSULT: Symptomatic right carotid disease  HPI: CALLIE QUIROS is a 75 y.o. male, who is seen for evaluation of right brain event. He is very pleasant active 75 year old gentleman who several weeks ago had an episode of classic right eye amaurosis fugax. He initially reports it was though is top half of his visual field was loss. He did have some improvement and now has slight amount of continued central blurring but is near baseline. Not seek medical attention regarding this. Yesterday he awoke with numbness and then noted weakness in his left arm. Reports that it was difficult to place his wallet into his pocket and do other routine task. He presented for further evaluation. Workup showed no evidence of acute stroke. He did have CT angiogram and I have reviewed this and discussed the films with the patient. He does have a moderate right carotid stenosis and has extreme irregular eccentric plaque in his bifurcation.  Past Medical History:  Diagnosis Date  . BCC (basal cell carcinoma) 2016   L preauricular/sideburn  . Benign prostatic hypertrophy   . Carotid bruit 2002   per chart review, right, neg u/s  . Diverticulitis   . ETOH abuse 1991   quit  . GERD (gastroesophageal reflux disease)   . Hyperlipidemia   . Hypertension   . 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 Hypothyroidism   . Renal calculus   . Tonsillar tag 2016   Dr. Redmond Baseman, ENT    Family History  Problem Relation Age of Onset  . Heart attack Mother 60  . Prostate cancer Father 24  . Cancer Brother     type?  . Diabetes Neg Hx   . Colon cancer Neg Hx     SOCIAL HISTORY: Social History   Social History  . Marital status: Divorced    Spouse name: N/A  . Number of children: 1  . Years of education: N/A   Occupational History  . retired ,  volunteers at [employer]    .  Retired   Social History Main Topics  . Smoking status: Former Smoker    Quit date: 04/10/1989  . Smokeless tobacco: Never Used  . Alcohol use No     Comment: quit - 04/1989  . Drug use: No  . Sexual activity: Not on file   Other Topics Concern  . Not on file   Social History Narrative   Divorced lives by self    one daughter in [place] to [place] in May 2008 from [place]     No Known Allergies  Current Facility-Administered Medications  Medication Dose Route Frequency Provider Last Rate Last Dose  .  stroke: mapping our Deondria Puryear stages of recovery book   Does not apply Once Kinnie Feil, MD      . atorvastatin (LIPITOR) tablet 40 mg  40 mg Oral QHS Kinnie Feil, MD   40 mg at 12/18/15 2155  . clopidogrel (PLAVIX) tablet 75 mg  75 mg Oral Daily Kinnie Feil, MD   75 mg at 12/19/15 0928  . enoxaparin (LOVENOX) injection 40 mg  40 mg Subcutaneous Q24H Patrecia Pour, MD      .  hydrALAZINE (APRESOLINE) injection 10 mg  10 mg Intravenous Q6H PRN Kinnie Feil, MD      . levothyroxine (SYNTHROID, LEVOTHROID) tablet 150 mcg  150 mcg Oral QAC breakfast Kinnie Feil, MD   150 mcg at 12/19/15 0928  . senna-docusate (Senokot-S) tablet 1 tablet  1 tablet Oral QHS PRN Kinnie Feil, MD      . tamsulosin (FLOMAX) capsule 0.4 mg  0.4 mg Oral QPC supper Kinnie Feil, MD        REVIEW OF SYSTEMS:  Reviewed in history and physical with nothing to add  PHYSICAL EXAM: Vitals:   12/18/15 2230 12/19/15 0030 12/19/15 0617 12/19/15 1005  BP: 131/72 132/65 (!) 150/69 133/66  Pulse:   (!) 56 (!) 59  Resp: 16 18 16 18   Temp: 98 F (36.7 C) 98.2 F (36.8 C) 98.2 F (36.8 C) 98.1 F (36.7 C)  TempSrc: Oral Oral Oral Oral  SpO2: 98% 99% 95% 96%  Weight:      Height:        GENERAL: The patient is a well-nourished male, in no acute distress. The vital signs are documented above. VASCULAR: Normal carotid pulsation. 2+ radial 2-3+ dorsalis pedis  pulses bilaterally PULMONARY: There is good air exchange  ABDOMEN: Soft and non-tender  MUSCULOSKELETAL: There are no major deformities or cyanosis. NEUROLOGIC: No focal weakness or paresthesias are detected. SKIN: There are no ulcers or rashes noted. PSYCHIATRIC: The patient has a normal affect.  DATA:  CT angiogram reviewed. Shows moderate to severe narrowing but extreme calcification and his bifurcation plaque.  MEDICAL ISSUES: Symptomatic right carotid stenosis with a recent episode of amaurosis fugax and TIA yesterday. Fortunately has had complete resolution of his symptoms. Explained the etiology of this and explained the recommendation for right carotid endarterectomy for reduction of stroke risk. Explained the procedure and expected one night hospitalization following the procedure. Explain the 1-2% risk of stroke with surgery itself. Have offered to proceed with surgery tomorrow. The patient reports that he has commitments and is unable to consider surgery on Monday Tuesday or Wednesday. I feel that it is acceptable to delay this brief period of time since he is not having crescendo TIAs. Again explained the critical importance for him to report immediately to the hospital should he have any further deficits. Mild office will contact him tomorrow on Monday to coordinate surgery at his convenience later this week   Rosetta Posner, MD [facility] Vascular and Vein Specialists of [facility] Tel [phone] Pager [phone]

## 2015-12-23 NOTE — Interval H&P Note (Signed)
History and Physical Interval Note:  12/23/2015 7:25 AM  Damon Foley  has presented today for surgery, with the diagnosis of Right carotid artery stenosis I65.21; Right eye amaurosis fugax H34.00  The various methods of treatment have been discussed with the patient and family. After consideration of risks, benefits and other options for treatment, the patient has consented to  Procedure(s): ENDARTERECTOMY CAROTID (Right) as a surgical intervention .  The patient's history has been reviewed, patient examined, no change in status, stable for surgery.  I have reviewed the patient's chart and labs.  Questions were answered to the patient's satisfaction.     Deitra Mayo

## 2015-12-23 NOTE — Care Management Note (Signed)
Case Management Note  Patient Details  Name: Damon Foley MRN: [id number] Date of Birth: May 20, 1940  Subjective/Objective:    Patient is s/p right CEA, NCM will cont to follow for dc needs.                 Action/Plan:   Expected Discharge Date:                  Expected Discharge Plan:  Home/Self Care  In-House Referral:     Discharge planning Services  CM Consult  Post Acute Care Choice:    Choice offered to:     DME Arranged:    DME Agency:     HH Arranged:    HH Agency:     Status of Service:  In process, will continue to follow  If discussed at Long Length of Stay Meetings, dates discussed:    Additional Comments:  Zenon Mayo, RN 12/23/2015, 5:16 PM

## 2015-12-23 NOTE — Progress Notes (Signed)
Pt admitted to 3s02 from PACU. Oriented to unit and room. Safety discussed and call bell with in reach. VSS. Will continue to monitor.   Erick Blinks, RN

## 2015-12-23 NOTE — Anesthesia Procedure Notes (Signed)
Procedure Name: Intubation Date/Time: 12/23/2015 7:50 AM Performed by: Mervyn Gay Pre-anesthesia Checklist: Patient identified, Patient being monitored, Timeout performed, Emergency Drugs available and Suction available Patient Re-evaluated:Patient Re-evaluated prior to inductionOxygen Delivery Method: Circle System Utilized Preoxygenation: Pre-oxygenation with 100% oxygen Intubation Type: IV induction Ventilation: Mask ventilation without difficulty Laryngoscope Size: Miller and 3 Grade View: Grade III Tube type: Oral Tube size: 7.5 mm Number of attempts: 1 Airway Equipment and Method: Stylet Placement Confirmation: ETT inserted through vocal cords under direct vision,  positive ETCO2 and breath sounds checked- equal and bilateral Secured at: 23 cm Tube secured with: Tape Dental Injury: Teeth and Oropharynx as per pre-operative assessment

## 2015-12-23 NOTE — Progress Notes (Addendum)
  Vascular and Vein Specialists Day of Surgery Note  Subjective:  Patient seen in PACU. Complaining of right neck incision discomfort. Also finds it difficult to void using urinal in the bed. Wants to sit on side of bed once in room.  Required I & O cath.   Vitals:   12/23/15 1250 12/23/15 1315  BP: 111/60   Pulse: (!) 48 (!) 41  Resp: 14 15  Temp:     Right neck incision without hematoma. Smile symmetric. Tongue midline. 5/5 strength upper and lower extremities bilaterally.   Assessment/Plan:  This is a 75 y.o. male who is s/p right CEA  Stable post-op. Neuro exam intact.  To 3S soon.    Virgina Jock, [place] [phone] 12/23/2015 3:04 PM  I have interviewed the patient and examined the patient. I agree with the findings by the PA.  Gae Gallop, MD [phone]

## 2015-12-23 NOTE — Anesthesia Preprocedure Evaluation (Signed)
Anesthesia Evaluation  Patient identified by MRN, date of birth, ID band Patient awake    Reviewed: Allergy & Precautions, NPO status , Patient's Chart, lab work & pertinent test results  History of Anesthesia Complications Negative for: history of anesthetic complications  Airway Mallampati: II  TM Distance: >3 FB Neck ROM: Full    Dental  (+) Partial Upper   Pulmonary neg shortness of breath, neg COPD, neg recent URI, former smoker,    breath sounds clear to auscultation       Cardiovascular hypertension, Pt. on medications + Peripheral Vascular Disease   Rhythm:Regular     Neuro/Psych  Headaches, CVA, No Residual Symptoms    GI/Hepatic GERD  ,  Endo/Other  Hypothyroidism   Renal/GU Renal InsufficiencyRenal disease     Musculoskeletal  (+) Arthritis ,   Abdominal   Peds  Hematology   Anesthesia Other Findings   Reproductive/Obstetrics                             Anesthesia Physical Anesthesia Plan  ASA: III  Anesthesia Plan: General   Post-op Pain Management:    Induction: Intravenous  Airway Management Planned: Oral ETT  Additional Equipment: Arterial line  Intra-op Plan:   Post-operative Plan: Extubation in OR  Informed Consent: I have reviewed the patients History and Physical, chart, labs and discussed the procedure including the risks, benefits and alternatives for the proposed anesthesia with the patient or authorized representative who has indicated his/her understanding and acceptance.   Dental advisory given  Plan Discussed with: CRNA and Surgeon  Anesthesia Plan Comments:         Anesthesia Quick Evaluation

## 2015-12-24 ENCOUNTER — Encounter (HOSPITAL_COMMUNITY): Payer: Self-pay | Admitting: Vascular Surgery

## 2015-12-24 LAB — BASIC METABOLIC PANEL
ANION GAP: 7 (ref 5–15)
BUN: 15 mg/dL (ref 6–20)
CALCIUM: 9.7 mg/dL (ref 8.9–10.3)
CO2: 20 mmol/L — ABNORMAL LOW (ref 22–32)
Chloride: 110 mmol/L (ref 101–111)
Creatinine, Ser: 1.2 mg/dL (ref 0.61–1.24)
GFR, EST NON AFRICAN AMERICAN: 57 mL/min — AB (ref >60)
GLUCOSE: 129 mg/dL — AB (ref 65–99)
POTASSIUM: 3.9 mmol/L (ref 3.5–5.1)
SODIUM: 137 mmol/L (ref 135–145)

## 2015-12-24 LAB — CBC
HCT: 39 % (ref 39.0–52.0)
Hemoglobin: 13.7 g/dL (ref 13.0–17.0)
MCH: 31.9 pg (ref 26.0–34.0)
MCHC: 35.1 g/dL (ref 30.0–36.0)
MCV: 90.9 fL (ref 78.0–100.0)
PLATELETS: 131 10*3/uL — AB (ref 150–400)
RBC: 4.29 MIL/uL (ref 4.22–5.81)
RDW: 12.8 % (ref 11.5–15.5)
WBC: 10.8 10*3/uL — AB (ref 4.0–10.5)

## 2015-12-24 MED ORDER — OXYCODONE-ACETAMINOPHEN 5-325 MG PO TABS: 1.0000 | ORAL_TABLET | Freq: Four times a day (QID) | ORAL | 0 refills | Status: DC | PRN

## 2015-12-24 MED ORDER — MELOXICAM 7.5 MG PO TABS: 7.5000 mg | ORAL_TABLET | Freq: Every day | ORAL | Status: DC

## 2015-12-24 NOTE — Progress Notes (Signed)
Explained and discussed discharge instructions, follow up appt., prescriptions given to pt. Clarified with K. Trinh PA okay to restart mobic. No complaints voiced.

## 2015-12-24 NOTE — Progress Notes (Addendum)
  Vascular and Vein Specialists Progress Note  Subjective  - POD #1  No complaints. Wants some coffee.   Objective Vitals:   12/23/15 2309 12/24/15 0354  BP: 114/60 104/60  Pulse: (!) 47 (!) 44  Resp: 14 17  Temp: 97.9 F (36.6 C) 98.2 F (36.8 C)    Intake/Output Summary (Last 24 hours) at 12/24/15 0731 Last data filed at 12/24/15 0600  Gross per 24 hour  Intake          2435.42 ml  Output             2035 ml  Net           400.42 ml   Right neck incision intact. No hematoma. 5/5 strength upper and lower extremities. Smile symmetric. Tongue without deviation.   Assessment/Planning: 75 y.o. male is s/p: right CEA 1 Day Post-Op   Neuro exam intact this am.  Neck without hematoma.  Has voided. D/c home today. F/u in 2 weeks.  Modified rankin: 0 VQI: on plavix and statin  Alvia Grove 12/24/2015 7:31 AM --  Laboratory CBC    Component Value Date/Time   WBC 10.8 (H) 12/24/2015 0350   HGB 13.7 12/24/2015 0350   HCT 39.0 12/24/2015 0350   PLT 131 (L) 12/24/2015 0350    BMET    Component Value Date/Time   NA 137 12/24/2015 0350   K 3.9 12/24/2015 0350   CL 110 12/24/2015 0350   CO2 20 (L) 12/24/2015 0350   GLUCOSE 129 (H) 12/24/2015 0350   BUN 15 12/24/2015 0350   CREATININE 1.20 12/24/2015 0350   CALCIUM 9.7 12/24/2015 0350   CALCIUM 9.8 02/20/2011 1057   GFRNONAA 57 (L) 12/24/2015 0350   GFRAA >60 12/24/2015 0350    COAG Lab Results  Component Value Date   INR 1.03 12/22/2015   No results found for: PTT  Antibiotics Anti-infectives    Start     Dose/Rate Route Frequency Ordered Stop   12/23/15 2000  cefUROXime (ZINACEF) 1.5 g in dextrose 5 % 50 mL IVPB     1.5 g 100 mL/hr over 30 Minutes Intravenous Every 12 hours 12/23/15 1657 12/24/15 1959   12/23/15 0526  cefUROXime (ZINACEF) 1.5 g in dextrose 5 % 50 mL IVPB     1.5 g 100 mL/hr over 30 Minutes Intravenous 30 min pre-op 12/23/15 0526 12/23/15 Mildred,  PA-C [facility] Office: [phone] Pager: [phone] 12/24/2015 7:31 AM  I have interviewed the patient and examined the patient. I agree with the findings by the PA.  Gae Gallop, MD [phone]

## 2015-12-24 NOTE — Anesthesia Postprocedure Evaluation (Signed)
Anesthesia Post Note  Patient: Delton See  Procedure(s) Performed: Procedure(s) (LRB): ENDARTERECTOMY CAROTID RIGHT (Right) PATCH ANGIOPLASTY RIGHT CAROTID USING XENOSURE BIOLOGIC PATCH (Right)  Patient location during evaluation: PACU Anesthesia Type: General Level of consciousness: awake Pain management: pain level controlled Vital Signs Assessment: post-procedure vital signs reviewed and stable Respiratory status: spontaneous breathing Cardiovascular status: stable Postop Assessment: no signs of nausea or vomiting Anesthetic complications: no    Last Vitals:  Vitals:   12/24/15 0354 12/24/15 0753  BP: 104/60 (!) 122/53  Pulse: (!) 44 (!) 52  Resp: 17 16  Temp: 36.8 C 36.6 C    Last Pain:  Vitals:   12/24/15 0753  TempSrc: Oral  PainSc: 0-No pain                 Vesna Kable

## 2015-12-24 NOTE — Care Management Note (Signed)
Case Management Note  Patient Details  Name: DAWOUD HOWLE MRN: [id number] Date of Birth: 03-28-1941  Subjective/Objective: Patient is s/p right CEA, he is indep pta, has medication coverage, he has a pcp and he states he has transportation at discharge.  No needs.                    Action/Plan:   Expected Discharge Date:  12/24/15               Expected Discharge Plan:  Home/Self Care  In-House Referral:     Discharge planning Services  CM Consult  Post Acute Care Choice:    Choice offered to:     DME Arranged:    DME Agency:     HH Arranged:    HH Agency:     Status of Service:  Completed, signed off  If discussed at [employer] of Stay Meetings, dates discussed:    Additional Comments:  Zenon Mayo, RN 12/24/2015, 10:00 AM

## 2015-12-27 NOTE — Discharge Summary (Signed)
Vascular and Vein Specialists Discharge Summary  Damon Foley [birth year] 75 y.o. male  [id number]  Admission Date: 12/23/2015  Discharge Date: 12/24/2015  Physician: Deitra Mayo, MD  Admission Diagnosis: Right carotid artery stenosis I65.21; Right eye amaurosis fugax H34.00  HPI:   This is a 75 y.o. male who is seen for evaluation of right brain event. He is very pleasant active 75 year old gentleman who several weeks ago had an episode of classic right eye amaurosis fugax. He initially reports it was though is top half of his visual field was loss. He did have some improvement and now has slight amount of continued central blurring but is near baseline. Not seek medical attention regarding this. Yesterday he awoke with numbness and then noted weakness in his left arm. Reports that it was difficult to place his wallet into his pocket and do other routine task. He presented for further evaluation. Workup showed no evidence of acute stroke. He did have CT angiogram and I have reviewed this and discussed the films with the patient. He does have a moderate right carotid stenosis and has extreme irregular eccentric plaque in his bifurcation.  Hospital Course:  The patient was admitted to the hospital and taken to the operating room on 12/23/2015 and underwent right carotid endarterectomy.  The patient tolerated the procedure well and was transported to the PACU in stable condition.  By POD 1, the patient's neuro status was intact. His right neck incision was intact with no hematoma. He was ambulating and voiding and tolerating a diet without difficulty. He was discharged home on postop day 1 in good condition.   No results for input(s): NA, K, CL, CO2, GLUCOSE, BUN, CALCIUM in the last 72 hours.  Invalid input(s): CRATININE No results for input(s): WBC, HGB, HCT, PLT in the last 72 hours. No results for input(s): INR in the last 72 hours.  Discharge Instructions:   The  patient is discharged to home with extensive instructions on wound care and progressive ambulation.  They are instructed not to drive or perform any heavy lifting until returning to see the physician in his office.  Discharge Instructions    CAROTID Sugery: Call MD for difficulty swallowing or speaking; weakness in arms or legs that is a new symtom; severe headache.  If you have increased swelling in the neck and/or  are having difficulty breathing, CALL [phone]    Complete by:  As directed    Call MD for:  redness, tenderness, or signs of infection (pain, swelling, bleeding, redness, odor or green/yellow discharge around incision site)    Complete by:  As directed    Call MD for:  severe or increased pain, loss or decreased feeling  in affected limb(s)    Complete by:  As directed    Call MD for:  temperature >100.5    Complete by:  As directed    Discharge wound care:    Complete by:  As directed    Wash wound daily with soap and water and pat dry. Do not apply any creams or ointments on your incisions.   Driving Restrictions    Complete by:  As directed    No driving for 1 week   Increase activity slowly    Complete by:  As directed    Walk with assistance use walker or cane as needed   Lifting restrictions    Complete by:  As directed    No lifting for 2 weeks   Resume previous diet  Complete by:  As directed       Discharge Diagnosis:  Right carotid artery stenosis I65.21; Right eye amaurosis fugax H34.00  Secondary Diagnosis: Patient Active Problem List   Diagnosis Date Noted  . Carotid stenosis 12/23/2015  . CVA (cerebral infarction) 12/18/2015  . Acute CVA (cerebrovascular accident) ([place]) 12/18/2015  . Visual disturbance 12/09/2015  . Bilateral shoulder pain 01/22/2015  . Follow-up--------------PCP notes 12/18/2014  . Cerumen impaction 11/11/2013  . Annual physical exam 02/20/2011  . HTN (hypertension) 06/11/2008  . HYPERGLYCEMIA, BORDERLINE 06/11/2008  .  Hyperparathyroidism ([place]) 12/13/2007  . Hypothyroidism 06/24/2007  . Hyperlipidemia 09/25/2006  . GERD 09/25/2006  . BPH (benign prostatic hyperplasia) 09/25/2006  . DIVERTICULITIS, HX OF 09/25/2006  . RENAL CALCULUS, HX OF 09/25/2006   Past Medical History:  Diagnosis Date  . Arthritis   . BCC (basal cell carcinoma) 2016   L preauricular/sideburn  . Benign prostatic hypertrophy   . Carotid bruit 2002   per chart review, right, neg u/s  . Carotid stenosis    right  . Diverticulitis   . Early cataracts, bilateral   . ETOH abuse 1991   quit  . GERD (gastroesophageal reflux disease)   . Headache   . Hyperlipidemia   . Hypertension   . 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 Hypothyroidism   . Renal calculus   . Stroke ([place])   . Tonsillar tag 2016   Dr. Redmond Baseman, ENT  . Wears glasses       Medication List    TAKE these medications   atenolol 50 MG tablet Commonly known as:  TENORMIN Take 1 tablet (50 mg total) by mouth daily.   atorvastatin 40 MG tablet Commonly known as:  LIPITOR Take 1 tablet (40 mg total) by mouth at bedtime.   clopidogrel 75 MG tablet Commonly known as:  PLAVIX Take 1 tablet (75 mg total) by mouth daily.   levothyroxine 150 MCG tablet Commonly known as:  SYNTHROID, LEVOTHROID Take 1 tablet (150 mcg total) by mouth daily before breakfast.   lisinopril 40 MG tablet Commonly known as:  PRINIVIL,ZESTRIL Take 1 tablet (40 mg total) by mouth daily.   meloxicam 7.5 MG tablet Commonly known as:  MOBIC Take 1 tablet (7.5 mg total) by mouth daily.   oxyCODONE-acetaminophen 5-325 MG tablet Commonly known as:  PERCOCET/ROXICET Take 1-2 tablets by mouth every 6 (six) hours as needed for moderate pain.   silodosin 8 MG Caps capsule Commonly known as:  RAPAFLO Take 1 capsule (8 mg total) by mouth daily with breakfast.       Percocet #8 No Refill  Disposition: Home  Patient's condition: is Good  Follow up: 1. Dr.  Scot Dock in 2  weeks.   Virgina Jock, PA-C [facility] [phone]  --- For [facility] use --- Instructions: Press F2 to tab through selections.  Delete question if not applicable.   Modified Rankin score at D/C (0-6): 0  IV medication needed for:  1. Hypertension: No 2. Hypotension: No  Post-op Complications: No  1. Post-op CVA or TIA: No  2. CN injury: No  3. Myocardial infarction: No  4.  CHF: No  5.  Dysrhythmia (new): No  6. Wound infection: No  7. Reperfusion symptoms: No  8. Return to OR: No  Discharge medications: Statin use:  Yes If No: [ ]  For Medical reasons, [ ]  Non-compliant, [ ]  Not-indicated ASA use:  No  If No: [ ]  For Medical reasons, [ ]  Non-compliant, [x ]  Not-indicated Beta blocker use:  Yes If No: [ ]  For Medical reasons, [ ]  Non-compliant, [ ]  Not-indicated ACE-Inhibitor use:  Yes If No: [ ]  For Medical reasons, [ ]  Non-compliant, [ ]  Not-indicated P2Y12 Antagonist use: Yes, [x ] Plavix, [ ]  Plasugrel, [ ]  Ticlopinine, [ ]  Ticagrelor, [ ]  Other, [ ]  No for medical reason, [ ]  Non-compliant, [ ]  Not-indicated Anti-coagulant use:  No, [ ]  Warfarin, [ ]  Rivaroxaban, [ ]  Dabigatran, [ ]  Other, [ ]  No for medical reason, [ ]  Non-compliant, [ ]  Not-indicated

## 2015-12-29 ENCOUNTER — Ambulatory Visit (HOSPITAL_BASED_OUTPATIENT_CLINIC_OR_DEPARTMENT_OTHER): Payer: Medicare Other

## 2015-12-31 ENCOUNTER — Telehealth: Payer: Self-pay | Admitting: Vascular Surgery

## 2015-12-31 ENCOUNTER — Ambulatory Visit (INDEPENDENT_AMBULATORY_CARE_PROVIDER_SITE_OTHER): Payer: Medicare Other | Admitting: Internal Medicine

## 2015-12-31 ENCOUNTER — Encounter: Payer: Self-pay | Admitting: Vascular Surgery

## 2015-12-31 ENCOUNTER — Encounter: Payer: Self-pay | Admitting: Internal Medicine

## 2015-12-31 VITALS — BP 130/78 | HR 70 | Temp 98.0°F | Resp 14 | Ht 71.0 in | Wt 218.0 lb

## 2015-12-31 DIAGNOSIS — I749 Embolism and thrombosis of unspecified artery: Secondary | ICD-10-CM

## 2015-12-31 DIAGNOSIS — I6521 Occlusion and stenosis of right carotid artery: Secondary | ICD-10-CM | POA: Diagnosis not present

## 2015-12-31 DIAGNOSIS — E785 Hyperlipidemia, unspecified: Secondary | ICD-10-CM | POA: Diagnosis not present

## 2015-12-31 DIAGNOSIS — I10 Essential (primary) hypertension: Secondary | ICD-10-CM | POA: Diagnosis not present

## 2015-12-31 DIAGNOSIS — E039 Hypothyroidism, unspecified: Secondary | ICD-10-CM

## 2015-12-31 DIAGNOSIS — I63131 Cerebral infarction due to embolism of right carotid artery: Secondary | ICD-10-CM

## 2015-12-31 MED ORDER — ATENOLOL 50 MG PO TABS: 50.0000 mg | ORAL_TABLET | Freq: Every day | ORAL | 1 refills | Status: AC

## 2015-12-31 MED ORDER — CLOPIDOGREL BISULFATE 75 MG PO TABS: 75.0000 mg | ORAL_TABLET | Freq: Every day | ORAL | 1 refills | Status: DC

## 2015-12-31 MED ORDER — LISINOPRIL 40 MG PO TABS: 40.0000 mg | ORAL_TABLET | Freq: Every day | ORAL | 1 refills | Status: AC

## 2015-12-31 MED ORDER — SILODOSIN 8 MG PO CAPS: 8.0000 mg | ORAL_CAPSULE | Freq: Every day | ORAL | 1 refills | Status: AC

## 2015-12-31 NOTE — Telephone Encounter (Signed)
Sched appt 9/27 at 1:45. Pt given reminder call.

## 2015-12-31 NOTE — Patient Instructions (Signed)
  GO TO THE FRONT DESK Schedule your next appointment for a  Check up 4 months from today  Schedule labs to be done 4 weeks from today, fasting

## 2015-12-31 NOTE — Progress Notes (Signed)
Pre visit review using our clinic review tool, if applicable. No additional management support is needed unless otherwise documented below in the visit note. 

## 2015-12-31 NOTE — Telephone Encounter (Signed)
-----   Message from Mena Goes, RN sent at 12/23/2015  3:10 PM EDT ----- Regarding: 2 week postop appt   ----- Message ----- From: Alvia Grove, PA-C Sent: 12/23/2015   3:03 PM To: Vvs Charge Pool  S/p right CEA 12/23/15  F/u with [facility] in 2 weeks  Thanks Maudie Mercury

## 2015-12-31 NOTE — Progress Notes (Signed)
Subjective:    Patient ID: Damon Foley, male    DOB: 1940-04-22, 75 y.o.   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 Further workup showed > 50% stenosis of the right ICA. Was recommended elective surgery and had a right carotid endarterectomy on 12/23/2015. Labs from the hospital reviewed, medication changes reviewed Good compliance of medications, needs refills   Review of Systems Since he has the surgery he is doing well. Not needing pain medication other than meloxicam. Denies nausea, vomiting, blood in the stools. No gross hematuria.   Past Medical History:  Diagnosis Date  . Arthritis   . BCC (basal cell carcinoma) 2016   L preauricular/sideburn  . Benign prostatic hypertrophy   . Carotid bruit 2002   per chart review, right, neg u/s  . Carotid stenosis    right  . Diverticulitis   . Early cataracts, bilateral   . ETOH abuse 1991   quit  . GERD (gastroesophageal reflux disease)   . Headache   . Hyperlipidemia   . Hypertension   . 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 Hypothyroidism   . Renal calculus   . Stroke ([place])   . Tonsillar tag 2016   Dr. Redmond Baseman, ENT  . Wears glasses     Past Surgical History:  Procedure Laterality Date  . CHOLECYSTECTOMY    . COLONOSCOPY W/ BIOPSIES AND POLYPECTOMY    . dental implants     04-2015  . ENDARTERECTOMY Right 12/23/2015   Procedure: ENDARTERECTOMY CAROTID RIGHT;  Surgeon: Angelia Mould, MD;  Location: [facility];  Service: Vascular;  Laterality: Right;  . HYDROCELE EXCISION / REPAIR  1987   LEFT  . KIDNEY STONE SURGERY  2006   right kidney stone removed, had stent, did not work,  . PATCH ANGIOPLASTY Right 12/23/2015   Procedure: PATCH ANGIOPLASTY RIGHT CAROTID USING Rueben Bash BIOLOGIC PATCH;  Surgeon:  Angelia Mould, MD;  Location: [facility];  Service: Vascular;  Laterality: Right;  . TONSILLECTOMY      Social History   Social History  . Marital status: Divorced    Spouse name: N/A  . Number of children: 1  . Years of education: N/A   Occupational History  . retired , volunteers at [employer]    .  Retired   Social History Main Topics  . Smoking status: Former Smoker    Quit date: 04/10/1989  . Smokeless tobacco: Never Used  . Alcohol use No     Comment: quit - 04/1989  . Drug use: No  . Sexual activity: Not on file   Other Topics Concern  . Not on file   Social History Narrative   Divorced lives by self    one daughter in [place] to [place] in May 2008 from [place]         Medication List       Accurate as of 12/31/15 11:01 AM. Always use your most recent med list.          atenolol 50 MG tablet Commonly known as:  TENORMIN Take 1 tablet (50 mg total) by mouth daily.   atorvastatin 40 MG tablet Commonly known as:  LIPITOR Take 1 tablet (40 mg total) by mouth at bedtime.   clopidogrel 75 MG tablet Commonly known as:  PLAVIX  Take 1 tablet (75 mg total) by mouth daily.   levothyroxine 150 MCG tablet Commonly known as:  SYNTHROID, LEVOTHROID Take 1 tablet (150 mcg total) by mouth daily before breakfast.   lisinopril 40 MG tablet Commonly known as:  PRINIVIL,ZESTRIL Take 1 tablet (40 mg total) by mouth daily.   meloxicam 7.5 MG tablet Commonly known as:  MOBIC Take 1 tablet (7.5 mg total) by mouth daily.   oxyCODONE-acetaminophen 5-325 MG tablet Commonly known as:  PERCOCET/ROXICET Take 1-2 tablets by mouth every 6 (six) hours as needed for moderate pain.   silodosin 8 MG Caps capsule Commonly known as:  RAPAFLO Take 1 capsule (8 mg total) by mouth daily with breakfast.          Objective:   Physical Exam BP 130/78 (BP Location: Left Arm, Patient Position: Sitting, Cuff Size: Normal)   Pulse 70   Temp 98 F (36.7 C) (Oral)   Resp 14    Ht 5\' 11"  (1.803 m)   Wt 218 lb (98.9 kg)   SpO2 97%   BMI 30.40 kg/m  General:   Well developed, well nourished . NAD.  HEENT:  Normocephalic . Face symmetric, atraumatic Lungs:  CTA B Normal respiratory effort, no intercostal retractions, no accessory muscle use. Heart: RRR,  no murmur.  No pretibial edema bilaterally  Skin: Not pale. Not jaundice Neurologic:  alert & oriented X3.  Speech normal, gait appropriate for age and unassisted. DTRs and motor symmetric. Face symmetric. EOMI Psych--  Cognition and judgment appear intact.  Cooperative with normal attention span and concentration.  Behavior appropriate. No anxious or depressed appearing.      Assessment & Plan:  Assessment >> HTN Hyperlipidemia Hypothyroidism Neuro:  9--2017 : amaurosis fugax, retina emboli -->  stroke -->  R carotid endarterectomy 12-23-15 Has residual visual defects, no other residual defects Carotid artery dz: [place] 12-10-15 minimal dz, CT angio: R carotid dz GU: BPH, renal calculus. On rapaflo, has residual sx (nocturia) GI: GERD, diverticulitis DJD H/o hyperparathyroidism 2004 (h/o elevated PTH, normalized, last PTH 04-17-14) H/o  EtOH quit 1991 H/o  Tonsillar tag saw ENT 2016 H/o carotid bruit 2002, neg  Ultrasound    PLAN Stroke: Recovering from stroke, no residual problems except for visual defects. Carotid artery disease: Recovering from a right carotid endarterectomy, aspirin was changed to Plavix. No symptoms of bleeding. Plan -control CV RFs. Hyperlipidemia: Based on last FLP, was switch from simva to Lipitor. Labs in 4 weeks Hypothyroidism: Base on last TSH, Synthroid dose adjusted, labs in 4 weeks We'll get a flu shot  next week Refill all medications. RTC for office visit in 4 months

## 2016-01-01 NOTE — Assessment & Plan Note (Signed)
Stroke: Recovering from stroke, no residual problems except for visual defects. Carotid artery disease: Recovering from a right carotid endarterectomy, aspirin was changed to Plavix. No symptoms of bleeding. Plan -control CV RFs. Hyperlipidemia: Based on last FLP, was switch from simva to Lipitor. Labs in 4 weeks Hypothyroidism: Base on last TSH, Synthroid dose adjusted, labs in 4 weeks We'll get a flu shot  next week Refill all medications. RTC for office visit in 4 months

## 2016-01-03 ENCOUNTER — Telehealth: Payer: Self-pay

## 2016-01-03 MED ORDER — ATORVASTATIN CALCIUM 40 MG PO TABS: 40.0000 mg | ORAL_TABLET | Freq: Every day | ORAL | 1 refills | Status: DC

## 2016-01-03 MED ORDER — MELOXICAM 7.5 MG PO TABS: 7.5000 mg | ORAL_TABLET | Freq: Every day | ORAL | 1 refills | Status: DC

## 2016-01-03 MED ORDER — LEVOTHYROXINE SODIUM 150 MCG PO TABS: 150.0000 ug | ORAL_TABLET | Freq: Every day | ORAL | 1 refills | Status: AC

## 2016-01-03 NOTE — Telephone Encounter (Signed)
Ok 90 and 1 RF 

## 2016-01-03 NOTE — Telephone Encounter (Signed)
Rx sent 

## 2016-01-03 NOTE — Telephone Encounter (Signed)
Pt is requesting refill on Meloxicam 7.5mg  tab. Please advise.

## 2016-01-05 ENCOUNTER — Ambulatory Visit (INDEPENDENT_AMBULATORY_CARE_PROVIDER_SITE_OTHER): Payer: Medicare Other | Admitting: Vascular Surgery

## 2016-01-05 ENCOUNTER — Encounter: Payer: Self-pay | Admitting: Vascular Surgery

## 2016-01-05 VITALS — BP 142/84 | HR 59 | Temp 97.9°F | Resp 18 | Ht 71.0 in | Wt 217.0 lb

## 2016-01-05 DIAGNOSIS — I6521 Occlusion and stenosis of right carotid artery: Secondary | ICD-10-CM

## 2016-01-05 NOTE — Progress Notes (Signed)
Patient name: Damon Foley MRN: [id number] DOB: 02/07/1941 Sex: male  REASON FOR VISIT: Follow up after right carotid endarterectomy  HPI: Damon Foley is a 75 y.o. male who was having episodes of right eye amaurosis fugax. He was found to have a focal calcific plaque. Right carotid endarterectomy was recommended in order to lower his risk of future stroke. He underwent a right carotid endarterectomy with bovine pericardial patch edge plasty and 12/23/2015. The surgery was actually quite challenging given that the internal carotid artery was rotated completely posteriorly and slightly medially making the dissection difficult. However the patient did well postoperatively and was discharged on postoperative day #1.  He comes in first first outpatient visit. He has no specific complaints. He denies focal weakness or paresthesias. He denies hoarseness or problems swallowing. He is on Plavix and is on Lipitor. He does not take aspirin because he is on Plavix.  Current Outpatient Prescriptions  Medication Sig Dispense Refill  . atenolol (TENORMIN) 50 MG tablet Take 1 tablet (50 mg total) by mouth daily. 90 tablet 1  . atorvastatin (LIPITOR) 40 MG tablet Take 1 tablet (40 mg total) by mouth at bedtime. 90 tablet 1  . clopidogrel (PLAVIX) 75 MG tablet Take 1 tablet (75 mg total) by mouth daily. 90 tablet 1  . levothyroxine (SYNTHROID, LEVOTHROID) 150 MCG tablet Take 1 tablet (150 mcg total) by mouth daily before breakfast. 90 tablet 1  . lisinopril (PRINIVIL,ZESTRIL) 40 MG tablet Take 1 tablet (40 mg total) by mouth daily. 90 tablet 1  . meloxicam (MOBIC) 7.5 MG tablet Take 1 tablet (7.5 mg total) by mouth daily. 90 tablet 1  . silodosin (RAPAFLO) 8 MG CAPS capsule Take 1 capsule (8 mg total) by mouth daily with breakfast. 90 capsule 1  . oxyCODONE-acetaminophen (PERCOCET/ROXICET) 5-325 MG tablet Take 1-2 tablets by mouth every 6 (six) hours as needed for moderate pain. (Patient not  taking: Reported on 01/05/2016) 8 tablet 0   No current facility-administered medications for this visit.     REVIEW OF SYSTEMS:  [X]  denotes positive finding, [ ]  denotes negative finding Cardiac  Comments:  Chest pain or chest pressure:    Shortness of breath upon exertion:    Short of breath when lying flat:    Irregular heart rhythm:    Constitutional    Fever or chills:      PHYSICAL EXAM: Vitals:   01/05/16 1350 01/05/16 1351  BP: (!) 149/81 (!) 142/84  Pulse: (!) 59   Resp: 18   Temp: 97.9 F (36.6 C)   TempSrc: Oral   SpO2: 98%   Weight: 217 lb (98.4 kg)   Height: 5\' 11"  (1.803 m)     GENERAL: The patient is a well-nourished male, in no acute distress. The vital signs are documented above. CARDIOVASCULAR: There is a regular rate and rhythm. PULMONARY: There is good air exchange bilaterally without wheezing or rales. His right neck incision is healing nicely. NEURO: He has no focal weakness or paresthesias.   MEDICAL ISSUES:  STATUS POST RIGHT CAROTID ENDARTERECTOMY:  The patient is doing well status post right carotid endarterectomy. He is asymptomatic. He is on a statin but is not on aspirin because he is on Plavix.  He is moving to [place]. I have explained that we would normally get a follow up ultrasound at 6 months one year and then yearly after that. I have asked him to arrange for this follow up once he gets the [place]. He  plans on moving the month. I'll be happy to see him back at any time when he is in town.   Deitra Mayo [facility] [phone]

## 2016-01-06 DIAGNOSIS — H349 Unspecified retinal vascular occlusion: Secondary | ICD-10-CM | POA: Diagnosis not present

## 2016-01-28 ENCOUNTER — Other Ambulatory Visit (INDEPENDENT_AMBULATORY_CARE_PROVIDER_SITE_OTHER): Payer: Medicare Other

## 2016-01-28 DIAGNOSIS — E785 Hyperlipidemia, unspecified: Secondary | ICD-10-CM

## 2016-01-28 DIAGNOSIS — E034 Atrophy of thyroid (acquired): Secondary | ICD-10-CM

## 2016-01-28 DIAGNOSIS — E039 Hypothyroidism, unspecified: Secondary | ICD-10-CM

## 2016-01-28 LAB — BASIC METABOLIC PANEL
BUN: 21 mg/dL (ref 6–23)
CALCIUM: 9.7 mg/dL (ref 8.4–10.5)
CHLORIDE: 107 meq/L (ref 96–112)
CO2: 28 meq/L (ref 19–32)
CREATININE: 1.14 mg/dL (ref 0.40–1.50)
GFR: 66.22 mL/min (ref >60.00)
Glucose, Bld: 113 mg/dL — ABNORMAL HIGH (ref 70–99)
Potassium: 4.3 mEq/L (ref 3.5–5.1)
Sodium: 139 mEq/L (ref 135–145)

## 2016-01-28 LAB — AST: AST: 22 U/L (ref 0–37)

## 2016-01-28 LAB — ALT: ALT: 34 U/L (ref 0–53)

## 2016-01-28 LAB — TSH: TSH: 0.69 u[IU]/mL (ref 0.35–4.50)

## 2016-02-11 ENCOUNTER — Telehealth: Payer: Self-pay | Admitting: Internal Medicine

## 2016-02-11 NOTE — Telephone Encounter (Signed)
Caller name: Relationship to patient: Self Can be reached: [phone]  Pharmacy:  [facility], [place] [phone] (Phone) [phone] (Fax)     Reason for call: Refills on  atorvastatin (LIPITOR) 40 MG tablet [id number]

## 2016-02-11 NOTE — Telephone Encounter (Signed)
Medication Detail    Disp Refills Start End   atorvastatin (LIPITOR) 40 MG tablet 90 tablet 1 01/03/2016    Sig - Route: Take 1 tablet (40 mg total) by mouth at bedtime. - Oral   E-Prescribing Status: Receipt confirmed by pharmacy (01/03/2016 11:21 AM EDT)

## 2016-02-15 ENCOUNTER — Other Ambulatory Visit: Payer: Self-pay

## 2016-02-15 MED ORDER — ATORVASTATIN CALCIUM 40 MG PO TABS: 40.0000 mg | ORAL_TABLET | Freq: Every day | ORAL | 2 refills | Status: AC

## 2016-05-05 ENCOUNTER — Encounter: Payer: Self-pay | Admitting: Internal Medicine

## 2016-05-05 ENCOUNTER — Ambulatory Visit (INDEPENDENT_AMBULATORY_CARE_PROVIDER_SITE_OTHER): Payer: Medicare Other | Admitting: Internal Medicine

## 2016-05-05 VITALS — BP 132/80 | HR 59 | Temp 98.2°F | Resp 14 | Ht 71.0 in | Wt 228.0 lb

## 2016-05-05 DIAGNOSIS — E785 Hyperlipidemia, unspecified: Secondary | ICD-10-CM

## 2016-05-05 DIAGNOSIS — I10 Essential (primary) hypertension: Secondary | ICD-10-CM | POA: Diagnosis not present

## 2016-05-05 DIAGNOSIS — I6521 Occlusion and stenosis of right carotid artery: Secondary | ICD-10-CM

## 2016-05-05 DIAGNOSIS — E034 Atrophy of thyroid (acquired): Secondary | ICD-10-CM | POA: Diagnosis not present

## 2016-05-05 DIAGNOSIS — M1990 Unspecified osteoarthritis, unspecified site: Secondary | ICD-10-CM

## 2016-05-05 MED ORDER — CLOPIDOGREL BISULFATE 75 MG PO TABS: 75.0000 mg | ORAL_TABLET | Freq: Every day | ORAL | 1 refills | Status: AC

## 2016-05-05 MED ORDER — MELOXICAM 7.5 MG PO TABS: 7.5000 mg | ORAL_TABLET | Freq: Every day | ORAL | 0 refills | Status: AC

## 2016-05-05 NOTE — Patient Instructions (Signed)
  GO TO THE FRONT DESK  Schedule labs to be done next week, fasting    Continue meloxicam daily as needed for pain.  Always take it with food because may cause gastritis and ulcers.  If you notice nausea, stomach pain, change in the color of stools --->  Stop the medicine and let us know  You can also take Tylenol  500 mg OTC 2 tabs a day every 8 hours as needed for pain

## 2016-05-05 NOTE — Progress Notes (Signed)
Pre visit review using our clinic review tool, if applicable. No additional management support is needed unless otherwise documented below in the visit note. 

## 2016-05-05 NOTE — Progress Notes (Signed)
Subjective:    Patient ID: Damon Foley, male    DOB: 1940-11-28, 76 y.o.   MRN: [id number]  DOS:  05/05/2016 Type of visit - description : Routine checkup Interval history: High cholesterol: Good med compliance, due for labs HTN: Good compliance, ambulatory BPs within normal DJD: on meloxicam daily, there are times he would like to increase the dose as sx not completely controlled.  Is that okay?. Carotid artery disease: Note from surgery reviewed, he is a stable.   Review of Systems Denies chest pain or difficulty breathing No nausea, abdominal pain or change in the color of the stools No headache, dizziness, diplopia. Visual defects from stroke are getting better.   Past Medical History:  Diagnosis Date  . Arthritis   . BCC (basal cell carcinoma) 2016   L preauricular/sideburn  . Benign prostatic hypertrophy   . Carotid bruit 2002   per chart review, right, neg u/s  . Carotid stenosis    right  . Diverticulitis   . Early cataracts, bilateral   . ETOH abuse 1991   quit  . GERD (gastroesophageal reflux disease)   . Headache   . Hyperlipidemia   . Hypertension   . 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 Hypothyroidism   . Renal calculus   . Stroke ([place])   . Tonsillar tag 2016   Dr. Redmond Baseman, ENT  . Wears glasses     Past Surgical History:  Procedure Laterality Date  . CAROTID ENDARTERECTOMY    . CHOLECYSTECTOMY    . COLONOSCOPY W/ BIOPSIES AND POLYPECTOMY    . dental implants     04-2015  . ENDARTERECTOMY Right 12/23/2015   Procedure: ENDARTERECTOMY CAROTID RIGHT;  Surgeon: Angelia Mould, MD;  Location: [facility];  Service: Vascular;  Laterality: Right;  . HYDROCELE EXCISION / REPAIR  1987   LEFT  . KIDNEY STONE SURGERY  2006   right kidney stone removed, had stent, did not work,  . PATCH ANGIOPLASTY Right 12/23/2015   Procedure: PATCH ANGIOPLASTY RIGHT CAROTID USING Rueben Bash BIOLOGIC PATCH;  Surgeon: Angelia Mould, MD;  Location: [facility];  Service:  Vascular;  Laterality: Right;  . TONSILLECTOMY      Social History   Social History  . Marital status: Divorced    Spouse name: N/A  . Number of children: 1  . Years of education: N/A   Occupational History  . retired , volunteers at [employer]    .  Retired   Social History Main Topics  . Smoking status: Former Smoker    Quit date: 04/10/1989  . Smokeless tobacco: Never Used  . Alcohol use No     Comment: quit - 04/1989  . Drug use: No  . Sexual activity: Not on file   Other Topics Concern  . Not on file   Social History Narrative   Divorced lives by self    one daughter in [place] to [place] in May 2008 from [place] as of 05/05/2016   No Known Allergies     Medication List       Accurate as of 05/05/16 11:59 PM. Always use your most recent med list.          atenolol 50 MG tablet Commonly known as:  TENORMIN Take 1 tablet (50 mg total) by mouth daily.   atorvastatin 40 MG tablet Commonly known as:  LIPITOR Take 1 tablet (40 mg total) by mouth at bedtime.  clopidogrel 75 MG tablet Commonly known as:  PLAVIX Take 1 tablet (75 mg total) by mouth daily.   levothyroxine 150 MCG tablet Commonly known as:  SYNTHROID, LEVOTHROID Take 1 tablet (150 mcg total) by mouth daily before breakfast.   lisinopril 40 MG tablet Commonly known as:  PRINIVIL,ZESTRIL Take 1 tablet (40 mg total) by mouth daily.   meloxicam 7.5 MG tablet Commonly known as:  MOBIC Take 1 tablet (7.5 mg total) by mouth daily.   silodosin 8 MG Caps capsule Commonly known as:  RAPAFLO Take 1 capsule (8 mg total) by mouth daily with breakfast.          Objective:   Physical Exam BP 132/80 (BP Location: Left Arm, Patient Position: Sitting, Cuff Size: Normal)   Pulse (!) 59   Temp 98.2 F (36.8 C) (Oral)   Resp 14   Ht 5\' 11"  (1.803 m)   Wt 228 lb (103.4 kg)   SpO2 98%   BMI 31.80 kg/m  General:   Well developed, well nourished . NAD.  HEENT:  Normocephalic .  Face symmetric, atraumatic Lungs:  CTA B Normal respiratory effort, no intercostal retractions, no accessory muscle use. Heart: RRR,  no murmur.  No pretibial edema bilaterally  Skin: Not pale. Not jaundice Neurologic:  alert & oriented X3.  Speech normal, gait appropriate for age and unassisted Psych--  Cognition and judgment appear intact.  Cooperative with normal attention span and concentration.  Behavior appropriate. No anxious or depressed appearing.      Assessment & Plan:   Assessment   HTN Hyperlipidemia Hypothyroidism Neuro:  9--2017 : amaurosis fugax, retina emboli -->  stroke -->  R carotid endarterectomy 12-23-15 Has residual visual defects, no other residual defects Carotid artery dz: [place] 12-10-15 minimal dz, CT angio: R carotid dz GU: BPH, renal calculus. On rapaflo, has residual sx (nocturia) GI: GERD, diverticulitis DJD H/o hyperparathyroidism 2004 (h/o elevated PTH, normalized, last PTH 04-17-14) H/o  EtOH quit 1991 H/o  Tonsillar tag saw ENT 2016 H/o carotid bruit 2002, neg  Ultrasound    PLAN HTN: Continue lisinopril, Tenormin, check a BMP. Hyperlipidemia: Now on Lipitor, check in a FLP, AST, ALT Hypothyroidism: On Synthroid, check a TSH Carotid artery disease: Seems to be doing great, per last visit with surgery. DJD: Symptoms relatively well controlled with meloxicam 7.5 daily, he knows to take it with food, denies GI side effects. Wonders if he could take a higher dose; I recommend not to d/t risk of GI bleeding -kidney failure; rather add Tylenol 500 mg 2 tablets 3 times a day. Patient is moving to [place], a number of records for printed for the patient to facilitate his care. Refill Plavix of meloxicam RTC when necessary

## 2016-05-07 NOTE — Assessment & Plan Note (Signed)
HTN: Continue lisinopril, Tenormin, check a BMP. Hyperlipidemia: Now on Lipitor, check in a FLP, AST, ALT Hypothyroidism: On Synthroid, check a TSH Carotid artery disease: Seems to be doing great, per last visit with surgery. DJD: Symptoms relatively well controlled with meloxicam 7.5 daily, he knows to take it with food, denies GI side effects. Wonders if he could take a higher dose; I recommend not to d/t risk of GI bleeding -kidney failure; rather add Tylenol 500 mg 2 tablets 3 times a day. Patient is moving to [place], a number of records for printed for the patient to facilitate his care. Refill Plavix of meloxicam RTC when necessary

## 2016-05-08 ENCOUNTER — Other Ambulatory Visit: Payer: Medicare Other

## 2016-05-08 ENCOUNTER — Other Ambulatory Visit (INDEPENDENT_AMBULATORY_CARE_PROVIDER_SITE_OTHER): Payer: Medicare Other

## 2016-05-08 DIAGNOSIS — E034 Atrophy of thyroid (acquired): Secondary | ICD-10-CM

## 2016-05-08 DIAGNOSIS — E785 Hyperlipidemia, unspecified: Secondary | ICD-10-CM | POA: Diagnosis not present

## 2016-05-08 DIAGNOSIS — I10 Essential (primary) hypertension: Secondary | ICD-10-CM

## 2016-05-08 LAB — BASIC METABOLIC PANEL
BUN: 17 mg/dL (ref 6–23)
CHLORIDE: 106 meq/L (ref 96–112)
CO2: 25 meq/L (ref 19–32)
CREATININE: 1.18 mg/dL (ref 0.40–1.50)
Calcium: 9.1 mg/dL (ref 8.4–10.5)
GFR: 63.59 mL/min (ref >60.00)
Glucose, Bld: 124 mg/dL — ABNORMAL HIGH (ref 70–99)
POTASSIUM: 3.8 meq/L (ref 3.5–5.1)
SODIUM: 137 meq/L (ref 135–145)

## 2016-05-08 LAB — LIPID PANEL
CHOLESTEROL: 117 mg/dL (ref 0–200)
HDL: 34 mg/dL — AB (ref >39.00)
LDL Cholesterol: 47 mg/dL (ref 0–99)
NONHDL: 82.82
Total CHOL/HDL Ratio: 3
Triglycerides: 178 mg/dL — ABNORMAL HIGH (ref 0.0–149.0)
VLDL: 35.6 mg/dL (ref 0.0–40.0)

## 2016-05-08 LAB — ALT: ALT: 30 U/L (ref 0–53)

## 2016-05-08 LAB — TSH: TSH: 1.32 u[IU]/mL (ref 0.35–4.50)

## 2016-05-08 LAB — AST: AST: 23 U/L (ref 0–37)

## 2016-08-17 DIAGNOSIS — Z87891 Personal history of nicotine dependence: Secondary | ICD-10-CM | POA: Diagnosis not present

## 2016-08-17 DIAGNOSIS — I69398 Other sequelae of cerebral infarction: Secondary | ICD-10-CM | POA: Diagnosis not present

## 2016-08-17 DIAGNOSIS — Z9889 Other specified postprocedural states: Secondary | ICD-10-CM | POA: Diagnosis not present

## 2016-08-17 DIAGNOSIS — I6521 Occlusion and stenosis of right carotid artery: Secondary | ICD-10-CM | POA: Diagnosis not present

## 2016-08-17 DIAGNOSIS — E039 Hypothyroidism, unspecified: Secondary | ICD-10-CM | POA: Diagnosis not present

## 2016-08-17 DIAGNOSIS — I10 Essential (primary) hypertension: Secondary | ICD-10-CM | POA: Diagnosis not present

## 2016-08-17 DIAGNOSIS — E785 Hyperlipidemia, unspecified: Secondary | ICD-10-CM | POA: Diagnosis not present

## 2016-08-17 DIAGNOSIS — G453 Amaurosis fugax: Secondary | ICD-10-CM | POA: Diagnosis not present

## 2016-08-23 ENCOUNTER — Telehealth: Payer: Self-pay | Admitting: Internal Medicine

## 2016-08-23 NOTE — Telephone Encounter (Signed)
Called patient to schedule awv. Lvm for pt to call office to schedule appt.

## 2016-09-06 DIAGNOSIS — I6529 Occlusion and stenosis of unspecified carotid artery: Secondary | ICD-10-CM | POA: Diagnosis not present

## 2016-09-06 DIAGNOSIS — R0989 Other specified symptoms and signs involving the circulatory and respiratory systems: Secondary | ICD-10-CM | POA: Diagnosis not present

## 2016-10-31 DIAGNOSIS — H34231 Retinal artery branch occlusion, right eye: Secondary | ICD-10-CM | POA: Diagnosis not present

## 2016-10-31 DIAGNOSIS — H2513 Age-related nuclear cataract, bilateral: Secondary | ICD-10-CM | POA: Diagnosis not present

## 2017-01-09 DIAGNOSIS — G453 Amaurosis fugax: Secondary | ICD-10-CM | POA: Diagnosis not present

## 2017-01-09 DIAGNOSIS — E785 Hyperlipidemia, unspecified: Secondary | ICD-10-CM | POA: Diagnosis not present

## 2017-01-09 DIAGNOSIS — I6521 Occlusion and stenosis of right carotid artery: Secondary | ICD-10-CM | POA: Diagnosis not present

## 2017-01-09 DIAGNOSIS — E039 Hypothyroidism, unspecified: Secondary | ICD-10-CM | POA: Diagnosis not present

## 2017-01-09 DIAGNOSIS — I10 Essential (primary) hypertension: Secondary | ICD-10-CM | POA: Diagnosis not present

## 2017-01-11 IMAGING — US US CAROTID DUPLEX BILAT
1 series · 13 of 24 positions shown · non-contrast
Comparison: None.

CLINICAL DATA: Cholesterol embolism to the right eye yesterday.
History of stroke / TIA, hypertension and hyperlipidemia.

EXAM:
BILATERAL CAROTID DUPLEX ULTRASOUND
TECHNIQUE: Gray scale imaging, color Doppler and duplex ultrasound were
performed of bilateral carotid and vertebral arteries in the neck.

[Series 1: us carotid duplex bilat · 0.06mm/px · 13 of 71 slices shown]
[im 1/71]
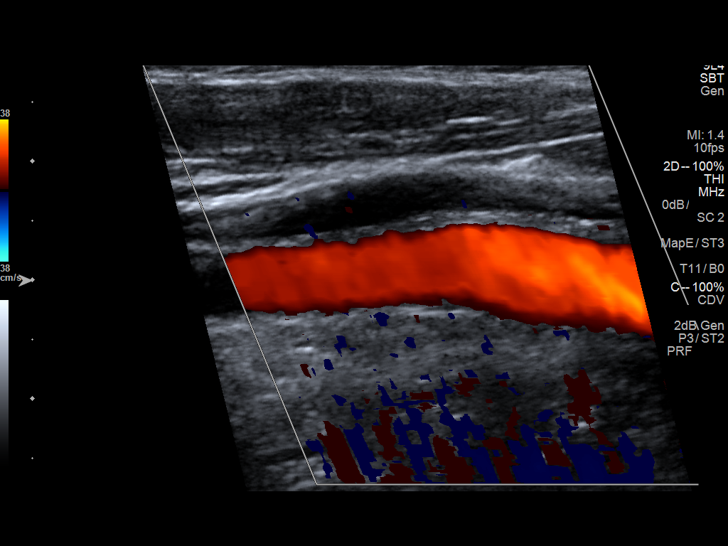
[im 7/71]
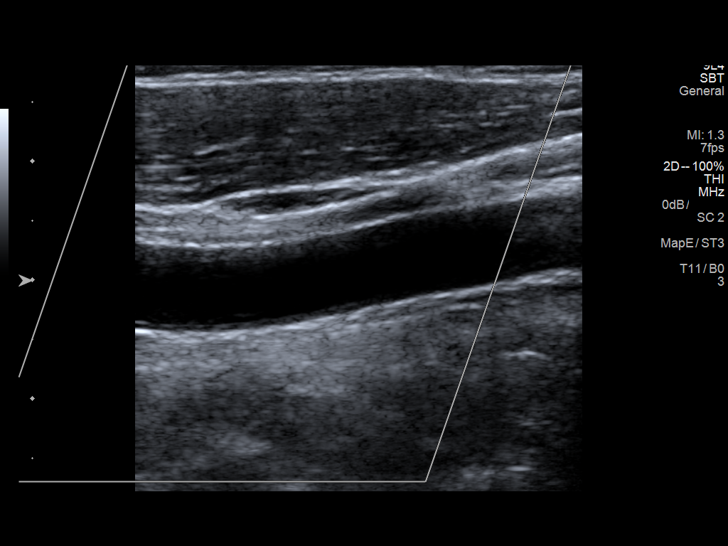
[im 13/71]
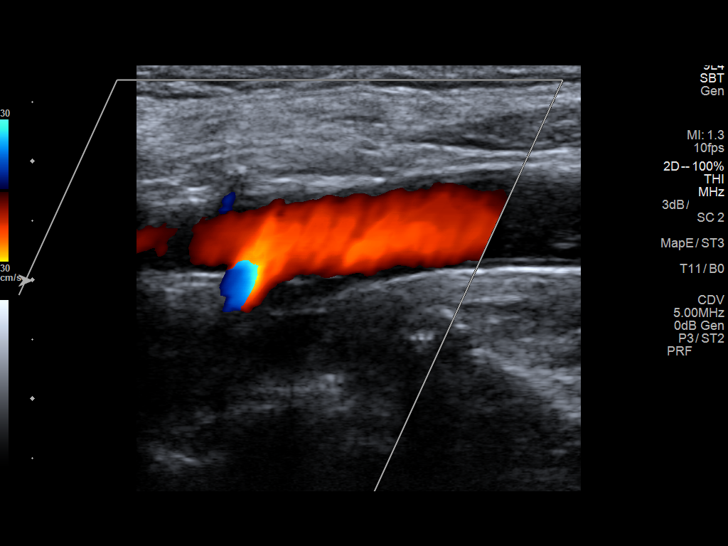
[im 19/71]
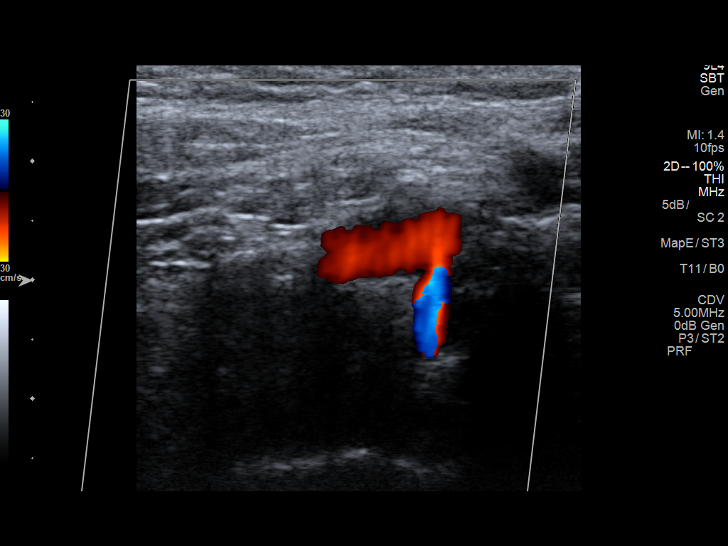
[im 25/71]
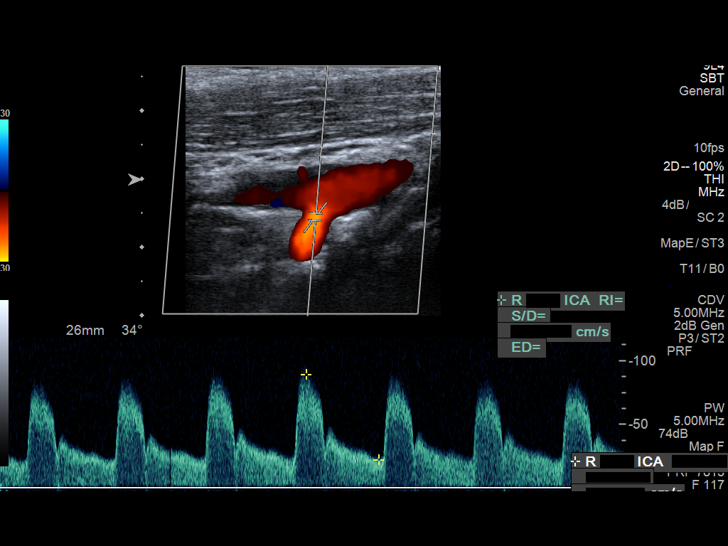
[im 31/71]
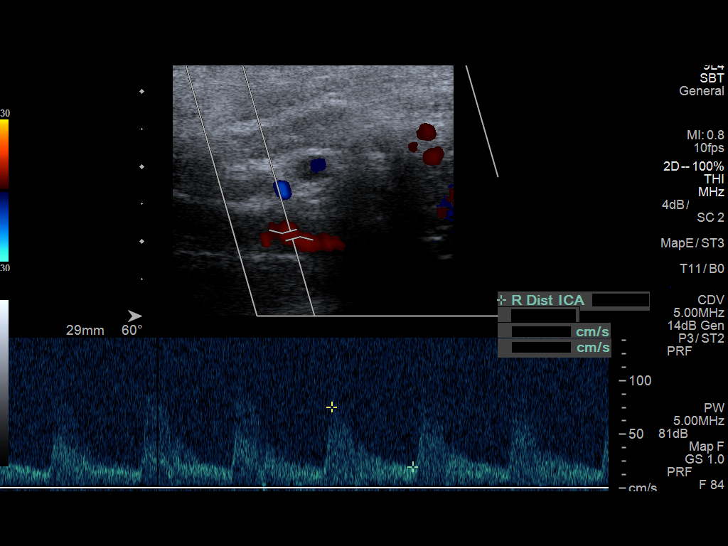
[im 37/71]
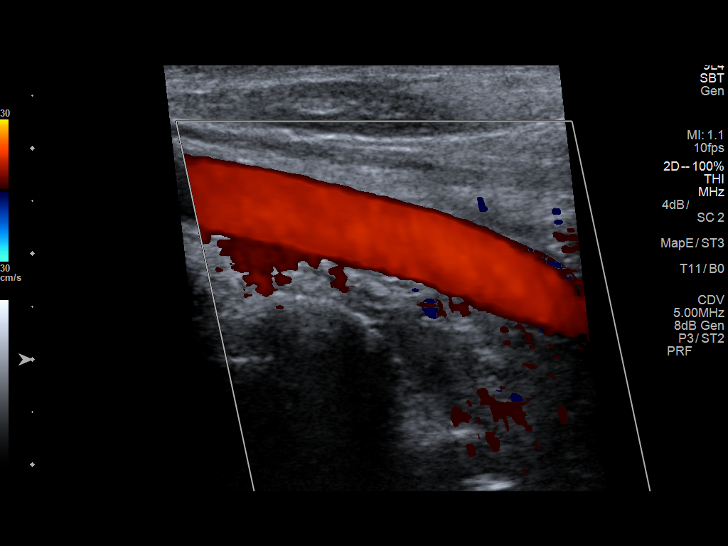
[im 40/71]
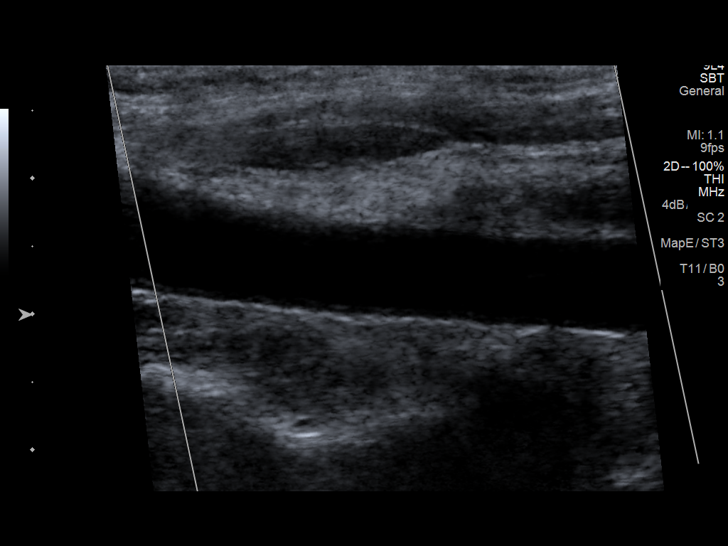
[im 46/71]
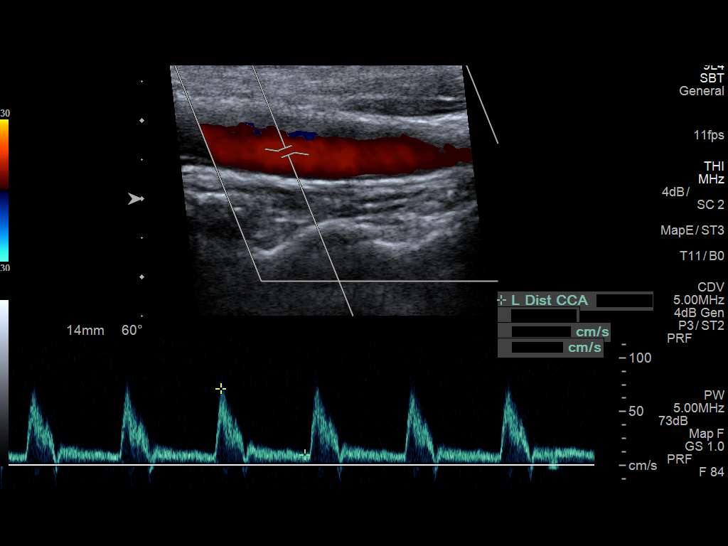
[im 52/71]
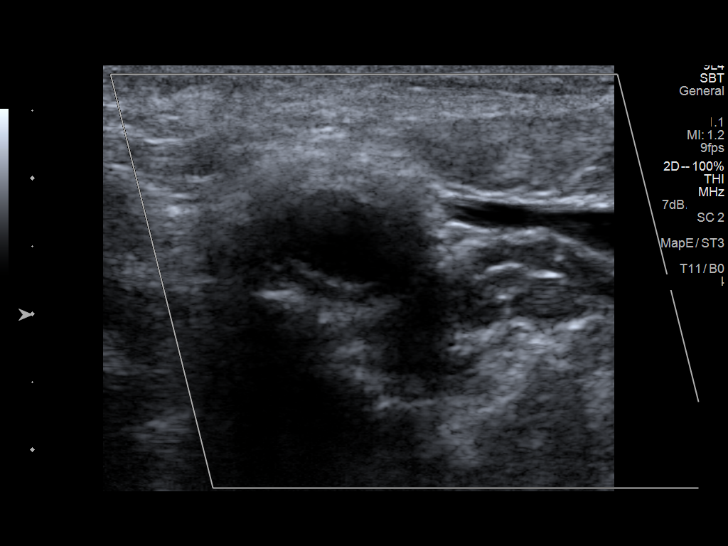
[im 58/71]
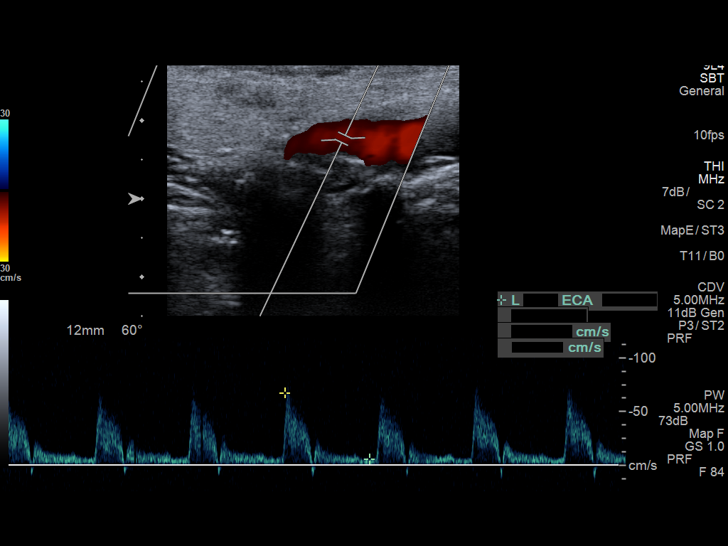
[im 64/71]
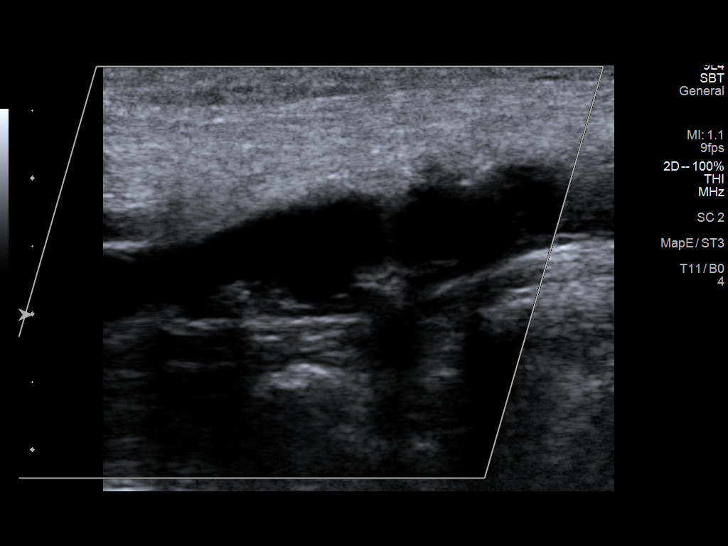
[im 71/71]
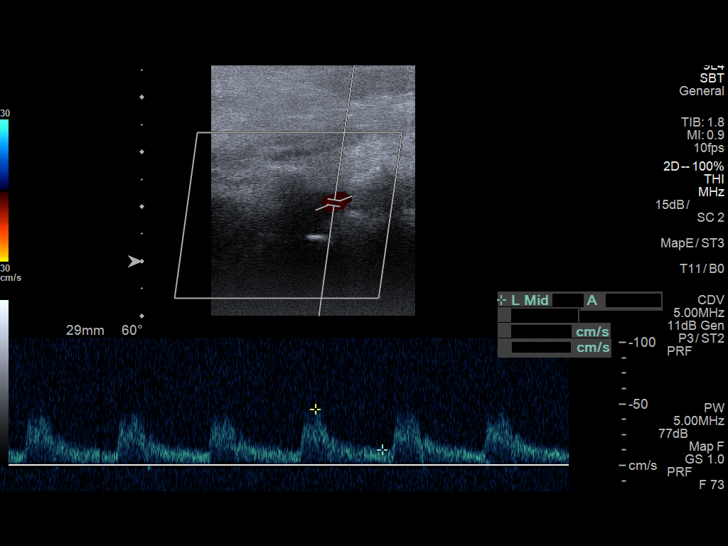

[13 of 24 positions shown; findings below may reference images not displayed]

FINDINGS: Criteria: Quantification of carotid stenosis is based on velocity
parameters that correlate the residual internal carotid diameter
with NASCET-based stenosis levels, using the diameter of the distal
internal carotid lumen as the denominator for stenosis measurement.

The following velocity measurements were obtained:

RIGHT

ICA:  91/28 cm/sec

CCA:  96/11 cm/sec

SYSTOLIC ICA/CCA RATIO:

DIASTOLIC ICA/CCA RATIO:

ECA:  82 cm/sec

LEFT

ICA:  81/30 cm/sec

CCA:  96/18 cm/sec

SYSTOLIC ICA/CCA RATIO:

DIASTOLIC ICA/CCA RATIO:

ECA:  67 cm/sec

RIGHT CAROTID ARTERY: There is a minimal amount of eccentric mixed
echogenic plaque within distal aspect of the right common carotid
artery (image 21 and 22). There is a minimal amount of eccentric
mixed echogenic plaque involving the origin and proximal aspects of
the right internal carotid artery (image 34), not resulting in
elevated peak systolic velocities within the interrogated course of
the right internal carotid artery to suggest a hemodynamically
significant stenosis.

RIGHT VERTEBRAL ARTERY:  Antegrade Flow

LEFT CAROTID ARTERY: There is a minimal amount of eccentric mixed
echogenic plaque within the left carotid bulb (images 61 and 62),
extending to involve the origin and proximal aspects of the left
internal carotid artery (image 71), not resulting in elevated peak
systolic velocities within the interrogated course the left internal
carotid artery to suggest a hemodynamically significant stenosis.

LEFT VERTEBRAL ARTERY:  Antegrade flow
IMPRESSION: Minimal amount of bilateral atherosclerotic plaque, right greater
than left, not resulting in a hemodynamically significant stenosis
within either internal carotid artery.

## 2017-01-23 DIAGNOSIS — E785 Hyperlipidemia, unspecified: Secondary | ICD-10-CM | POA: Diagnosis not present

## 2017-01-23 DIAGNOSIS — E039 Hypothyroidism, unspecified: Secondary | ICD-10-CM | POA: Diagnosis not present

## 2017-01-23 DIAGNOSIS — M1990 Unspecified osteoarthritis, unspecified site: Secondary | ICD-10-CM | POA: Diagnosis not present

## 2017-01-23 DIAGNOSIS — I10 Essential (primary) hypertension: Secondary | ICD-10-CM | POA: Diagnosis not present

## 2017-01-29 DIAGNOSIS — Z8673 Personal history of transient ischemic attack (TIA), and cerebral infarction without residual deficits: Secondary | ICD-10-CM | POA: Diagnosis not present

## 2017-01-29 DIAGNOSIS — N281 Cyst of kidney, acquired: Secondary | ICD-10-CM | POA: Diagnosis not present

## 2017-01-29 DIAGNOSIS — K352 Acute appendicitis with generalized peritonitis, without abscess: Secondary | ICD-10-CM | POA: Diagnosis not present

## 2017-01-29 DIAGNOSIS — K219 Gastro-esophageal reflux disease without esophagitis: Secondary | ICD-10-CM | POA: Diagnosis not present

## 2017-01-29 DIAGNOSIS — K598 Other specified functional intestinal disorders: Secondary | ICD-10-CM | POA: Diagnosis not present

## 2017-01-29 DIAGNOSIS — E785 Hyperlipidemia, unspecified: Secondary | ICD-10-CM | POA: Diagnosis not present

## 2017-01-29 DIAGNOSIS — Z87891 Personal history of nicotine dependence: Secondary | ICD-10-CM | POA: Diagnosis not present

## 2017-01-29 DIAGNOSIS — E213 Hyperparathyroidism, unspecified: Secondary | ICD-10-CM | POA: Diagnosis not present

## 2017-01-29 DIAGNOSIS — K3532 Acute appendicitis with perforation and localized peritonitis, without abscess: Secondary | ICD-10-CM | POA: Diagnosis not present

## 2017-01-29 DIAGNOSIS — I10 Essential (primary) hypertension: Secondary | ICD-10-CM | POA: Diagnosis not present

## 2017-01-29 DIAGNOSIS — K4090 Unilateral inguinal hernia, without obstruction or gangrene, not specified as recurrent: Secondary | ICD-10-CM | POA: Diagnosis not present

## 2017-01-29 DIAGNOSIS — Z9049 Acquired absence of other specified parts of digestive tract: Secondary | ICD-10-CM | POA: Diagnosis not present

## 2017-01-29 DIAGNOSIS — E039 Hypothyroidism, unspecified: Secondary | ICD-10-CM | POA: Diagnosis not present

## 2017-01-29 DIAGNOSIS — K37 Unspecified appendicitis: Secondary | ICD-10-CM | POA: Diagnosis not present

## 2017-01-29 DIAGNOSIS — K3580 Unspecified acute appendicitis: Secondary | ICD-10-CM | POA: Diagnosis not present

## 2017-01-30 DIAGNOSIS — Z9089 Acquired absence of other organs: Secondary | ICD-10-CM | POA: Diagnosis not present

## 2017-01-30 DIAGNOSIS — M1990 Unspecified osteoarthritis, unspecified site: Secondary | ICD-10-CM | POA: Diagnosis present

## 2017-01-30 DIAGNOSIS — N401 Enlarged prostate with lower urinary tract symptoms: Secondary | ICD-10-CM | POA: Diagnosis not present

## 2017-01-30 DIAGNOSIS — N9989 Other postprocedural complications and disorders of genitourinary system: Secondary | ICD-10-CM | POA: Diagnosis not present

## 2017-01-30 DIAGNOSIS — E213 Hyperparathyroidism, unspecified: Secondary | ICD-10-CM | POA: Diagnosis present

## 2017-01-30 DIAGNOSIS — K352 Acute appendicitis with generalized peritonitis, without abscess: Secondary | ICD-10-CM | POA: Diagnosis present

## 2017-01-30 DIAGNOSIS — K3580 Unspecified acute appendicitis: Secondary | ICD-10-CM | POA: Diagnosis not present

## 2017-01-30 DIAGNOSIS — Z87891 Personal history of nicotine dependence: Secondary | ICD-10-CM | POA: Diagnosis not present

## 2017-01-30 DIAGNOSIS — E039 Hypothyroidism, unspecified: Secondary | ICD-10-CM | POA: Diagnosis present

## 2017-01-30 DIAGNOSIS — R338 Other retention of urine: Secondary | ICD-10-CM | POA: Diagnosis not present

## 2017-01-30 DIAGNOSIS — K219 Gastro-esophageal reflux disease without esophagitis: Secondary | ICD-10-CM | POA: Diagnosis present

## 2017-01-30 DIAGNOSIS — E785 Hyperlipidemia, unspecified: Secondary | ICD-10-CM | POA: Diagnosis present

## 2017-01-30 DIAGNOSIS — I10 Essential (primary) hypertension: Secondary | ICD-10-CM | POA: Diagnosis present

## 2017-01-30 DIAGNOSIS — Z8673 Personal history of transient ischemic attack (TIA), and cerebral infarction without residual deficits: Secondary | ICD-10-CM | POA: Diagnosis not present

## 2017-01-30 DIAGNOSIS — Z7982 Long term (current) use of aspirin: Secondary | ICD-10-CM | POA: Diagnosis not present

## 2017-02-02 DIAGNOSIS — N9989 Other postprocedural complications and disorders of genitourinary system: Secondary | ICD-10-CM | POA: Diagnosis not present

## 2017-02-02 DIAGNOSIS — Z9089 Acquired absence of other organs: Secondary | ICD-10-CM | POA: Diagnosis not present

## 2017-02-02 DIAGNOSIS — N401 Enlarged prostate with lower urinary tract symptoms: Secondary | ICD-10-CM | POA: Diagnosis not present

## 2017-02-02 DIAGNOSIS — R338 Other retention of urine: Secondary | ICD-10-CM | POA: Diagnosis not present

## 2017-02-06 DIAGNOSIS — Z960 Presence of urogenital implants: Secondary | ICD-10-CM | POA: Diagnosis not present

## 2017-02-06 DIAGNOSIS — R319 Hematuria, unspecified: Secondary | ICD-10-CM | POA: Diagnosis not present

## 2017-02-07 DIAGNOSIS — N401 Enlarged prostate with lower urinary tract symptoms: Secondary | ICD-10-CM | POA: Diagnosis not present

## 2017-02-07 DIAGNOSIS — R339 Retention of urine, unspecified: Secondary | ICD-10-CM | POA: Diagnosis not present

## 2017-03-13 DIAGNOSIS — I6529 Occlusion and stenosis of unspecified carotid artery: Secondary | ICD-10-CM | POA: Diagnosis not present

## 2017-03-14 IMAGING — DX DG SHOULDER 2+V*R*
3 series · 3 of 3 positions shown · non-contrast
Comparison: None.

CLINICAL DATA: Bilateral shoulder pain for over 1 month, right
greater than left.

EXAM:
RIGHT SHOULDER - 2+ VIEW

[shoulder grashey]
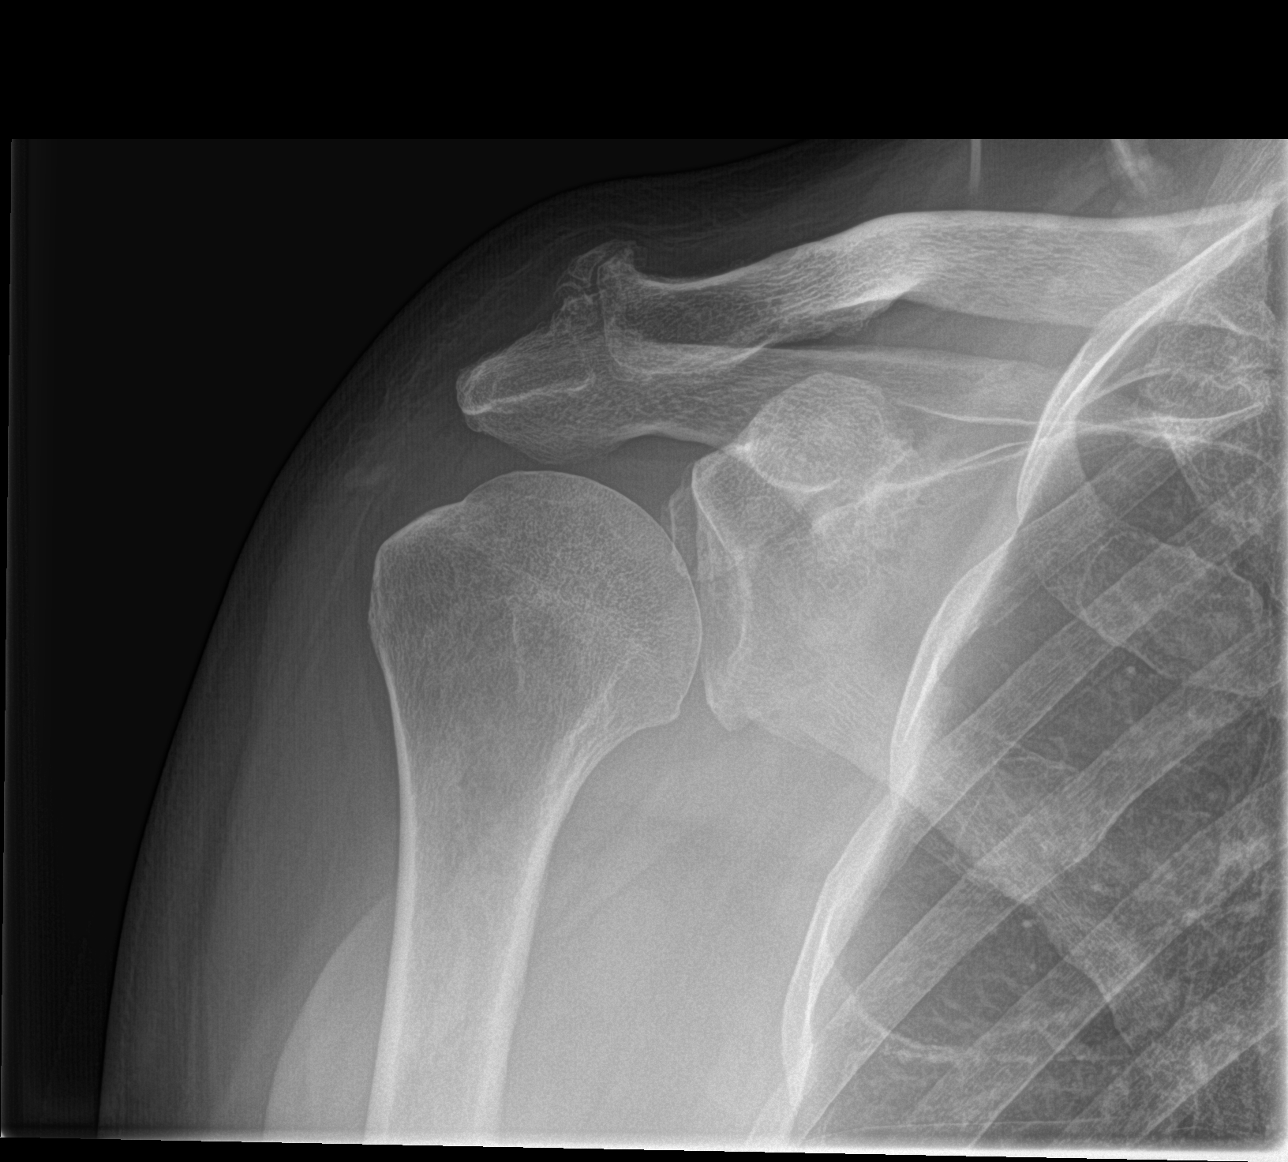

[shoulder y view]
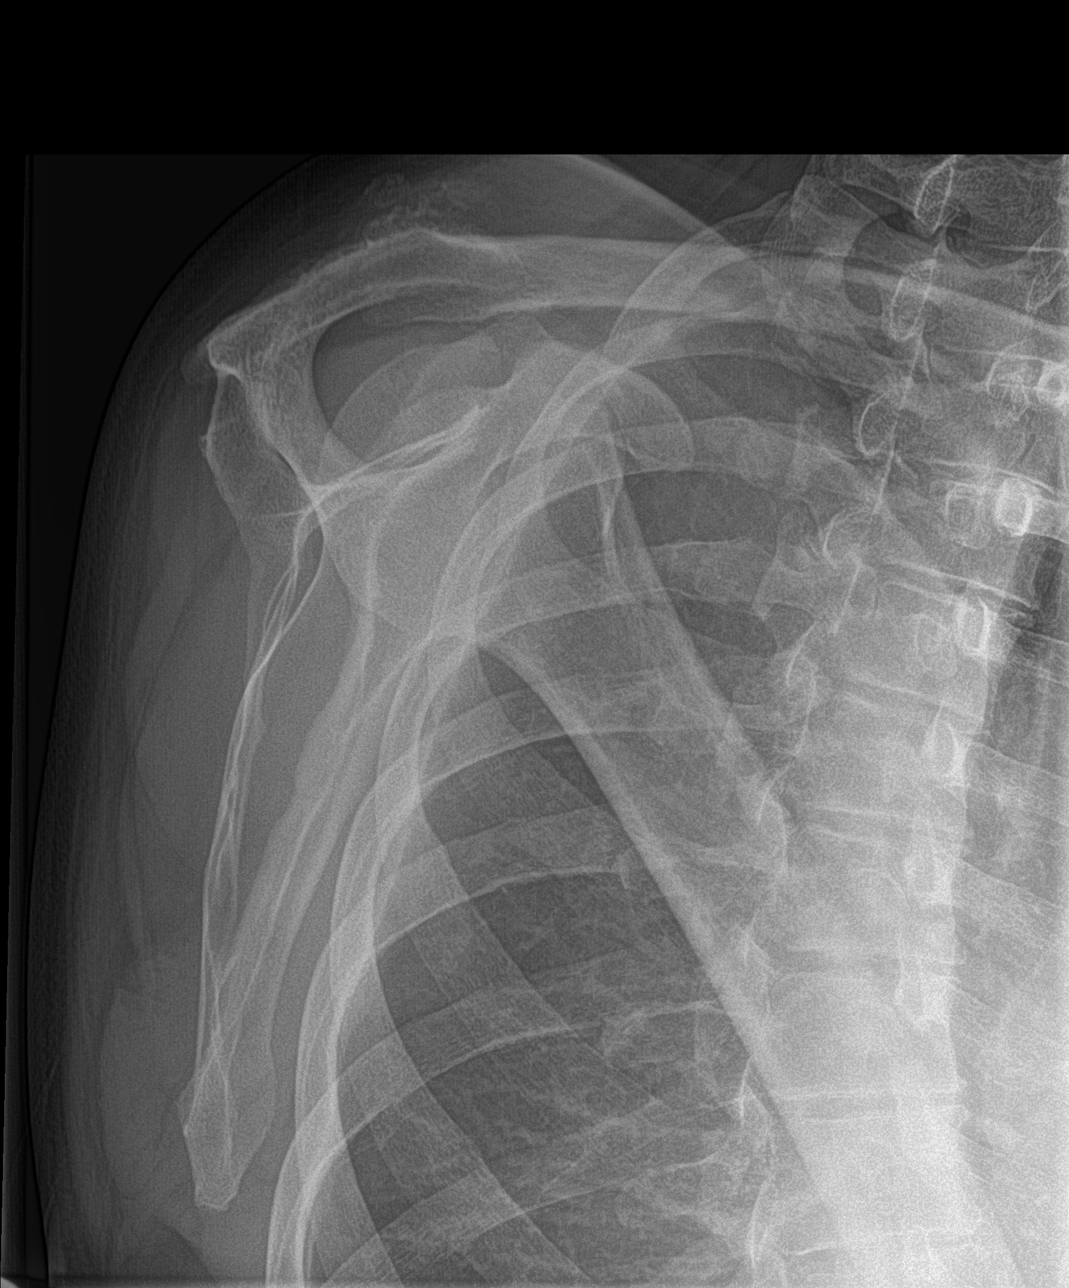

[shoulder axillary]
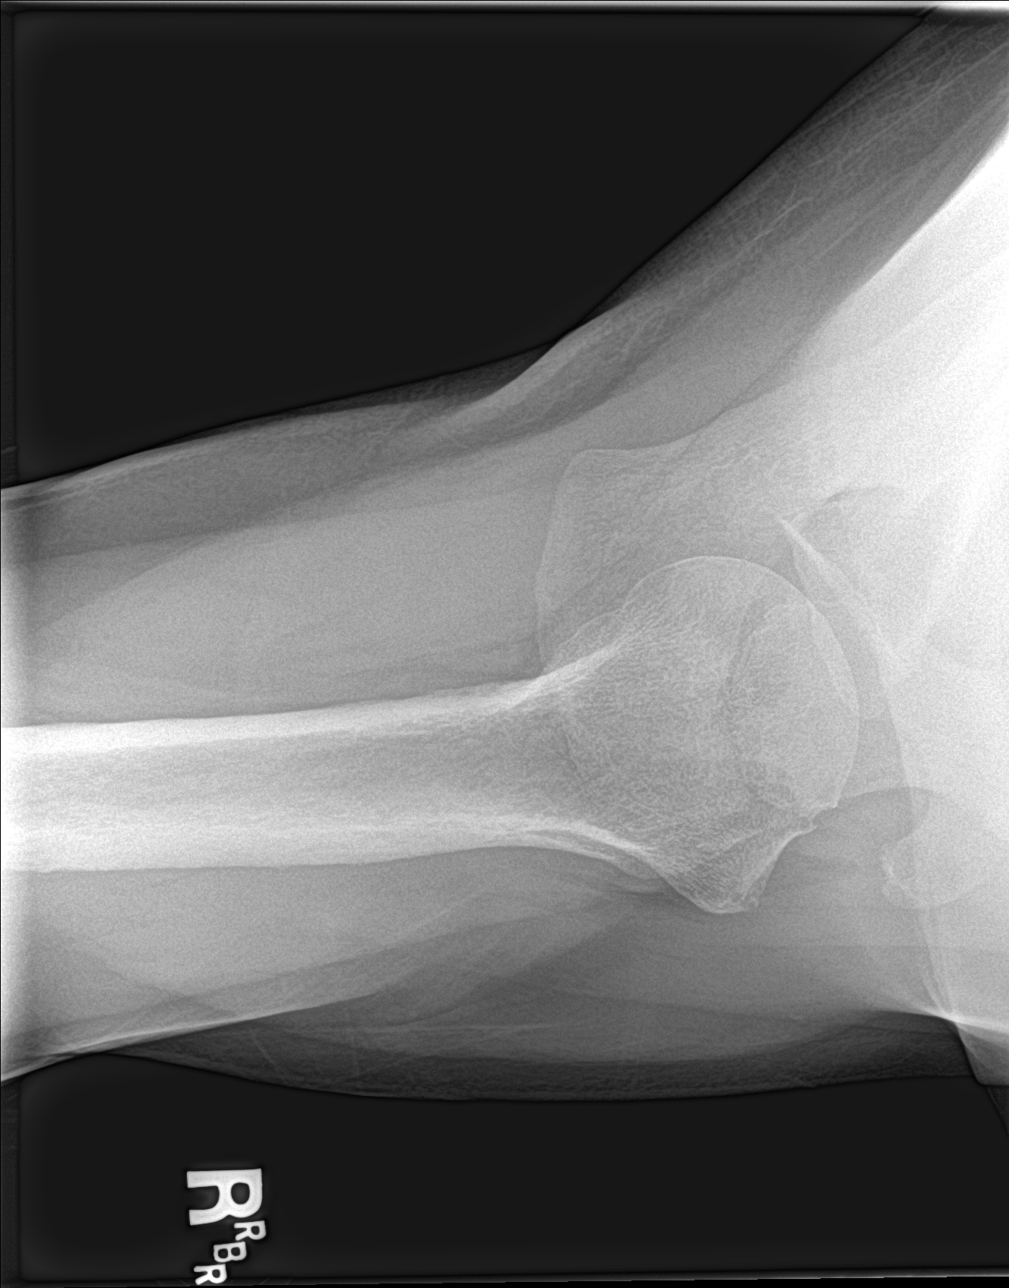

[3 of 3 positions shown; findings below may reference images not displayed]

FINDINGS: Fairly prominent degenerative joint space narrowing and osseous
spurring is noted at the right acromioclavicular joint space.
Alignment at the AC joint remains normal. Alignment at the
underlying glenohumeral joint space is normal and there is no
evidence of advanced degenerative joint disease at the glenohumeral
joint.

No fracture line or displaced fracture fragment seen. No acute
-appearing cortical irregularity or osseous lesion. Visualized
portion of the right upper lung is clear. Soft tissues about the
right shoulder are unremarkable.
IMPRESSION: 1. Advanced degenerative joint disease at the right
acromioclavicular joint space, with associated joint space narrowing
and osseous spurring/osteophyte formation.
2. No acute findings.

## 2017-03-20 DIAGNOSIS — N401 Enlarged prostate with lower urinary tract symptoms: Secondary | ICD-10-CM | POA: Diagnosis not present

## 2017-05-09 DIAGNOSIS — K4090 Unilateral inguinal hernia, without obstruction or gangrene, not specified as recurrent: Secondary | ICD-10-CM | POA: Diagnosis not present

## 2017-05-09 DIAGNOSIS — Z87891 Personal history of nicotine dependence: Secondary | ICD-10-CM | POA: Diagnosis not present

## 2017-05-21 DIAGNOSIS — Z87891 Personal history of nicotine dependence: Secondary | ICD-10-CM | POA: Diagnosis not present

## 2017-05-21 DIAGNOSIS — K4090 Unilateral inguinal hernia, without obstruction or gangrene, not specified as recurrent: Secondary | ICD-10-CM | POA: Diagnosis not present

## 2017-06-08 DIAGNOSIS — N9989 Other postprocedural complications and disorders of genitourinary system: Secondary | ICD-10-CM | POA: Diagnosis not present

## 2017-06-08 DIAGNOSIS — Y838 Other surgical procedures as the cause of abnormal reaction of the patient, or of later complication, without mention of misadventure at the time of the procedure: Secondary | ICD-10-CM | POA: Diagnosis not present

## 2017-06-08 DIAGNOSIS — R338 Other retention of urine: Secondary | ICD-10-CM | POA: Diagnosis not present

## 2017-06-08 DIAGNOSIS — N401 Enlarged prostate with lower urinary tract symptoms: Secondary | ICD-10-CM | POA: Diagnosis not present

## 2017-06-08 DIAGNOSIS — Z960 Presence of urogenital implants: Secondary | ICD-10-CM | POA: Diagnosis not present

## 2017-06-08 DIAGNOSIS — K4090 Unilateral inguinal hernia, without obstruction or gangrene, not specified as recurrent: Secondary | ICD-10-CM | POA: Diagnosis not present

## 2017-06-22 DIAGNOSIS — N401 Enlarged prostate with lower urinary tract symptoms: Secondary | ICD-10-CM | POA: Diagnosis not present

## 2017-06-22 DIAGNOSIS — R339 Retention of urine, unspecified: Secondary | ICD-10-CM | POA: Diagnosis not present

## 2017-07-16 DIAGNOSIS — K219 Gastro-esophageal reflux disease without esophagitis: Secondary | ICD-10-CM | POA: Diagnosis not present

## 2017-07-16 DIAGNOSIS — I10 Essential (primary) hypertension: Secondary | ICD-10-CM | POA: Diagnosis not present

## 2017-07-16 DIAGNOSIS — E039 Hypothyroidism, unspecified: Secondary | ICD-10-CM | POA: Diagnosis not present

## 2017-07-20 DIAGNOSIS — N401 Enlarged prostate with lower urinary tract symptoms: Secondary | ICD-10-CM | POA: Diagnosis not present

## 2017-07-24 DIAGNOSIS — R7301 Impaired fasting glucose: Secondary | ICD-10-CM | POA: Diagnosis not present

## 2017-07-24 DIAGNOSIS — E039 Hypothyroidism, unspecified: Secondary | ICD-10-CM | POA: Diagnosis not present

## 2017-07-24 DIAGNOSIS — I10 Essential (primary) hypertension: Secondary | ICD-10-CM | POA: Diagnosis not present

## 2017-07-24 DIAGNOSIS — F17290 Nicotine dependence, other tobacco product, uncomplicated: Secondary | ICD-10-CM | POA: Diagnosis not present

## 2017-07-24 DIAGNOSIS — E785 Hyperlipidemia, unspecified: Secondary | ICD-10-CM | POA: Diagnosis not present

## 2017-07-24 DIAGNOSIS — N400 Enlarged prostate without lower urinary tract symptoms: Secondary | ICD-10-CM | POA: Diagnosis not present

## 2017-08-07 DIAGNOSIS — R221 Localized swelling, mass and lump, neck: Secondary | ICD-10-CM | POA: Diagnosis not present

## 2017-08-07 DIAGNOSIS — Z9889 Other specified postprocedural states: Secondary | ICD-10-CM | POA: Diagnosis not present

## 2017-08-10 DIAGNOSIS — R221 Localized swelling, mass and lump, neck: Secondary | ICD-10-CM | POA: Diagnosis not present

## 2017-08-21 DIAGNOSIS — Z139 Encounter for screening, unspecified: Secondary | ICD-10-CM | POA: Diagnosis not present

## 2017-08-23 DIAGNOSIS — R221 Localized swelling, mass and lump, neck: Secondary | ICD-10-CM | POA: Diagnosis not present

## 2017-08-23 DIAGNOSIS — M5382 Other specified dorsopathies, cervical region: Secondary | ICD-10-CM | POA: Diagnosis not present

## 2017-11-12 DIAGNOSIS — R221 Localized swelling, mass and lump, neck: Secondary | ICD-10-CM | POA: Diagnosis not present

## 2017-11-12 DIAGNOSIS — Z87891 Personal history of nicotine dependence: Secondary | ICD-10-CM | POA: Diagnosis not present

## 2017-11-13 DIAGNOSIS — R221 Localized swelling, mass and lump, neck: Secondary | ICD-10-CM | POA: Diagnosis not present

## 2018-02-11 IMAGING — MR MR MRA HEAD W/O CM
1 series · 19 of 48 positions shown · non-contrast
Comparison: MRI brain from the same day.

CLINICAL DATA: Posterior right frontal lobe infarct. New onset left
arm weakness and numbness.

EXAM:
MRA HEAD WITHOUT CONTRAST
TECHNIQUE: Angiographic images of the Circle of Willis were obtained using MRA
technique without intravenous contrast.

[Series 3: ax (id) 2 · axial · 1.0mm · 0.43mm/px · z∈[-56,+25]mm · 19 of 176 slices shown]
[im 1/176]
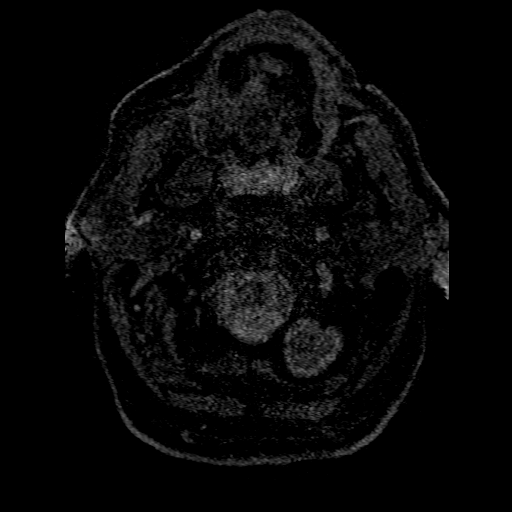
[im 4/176]
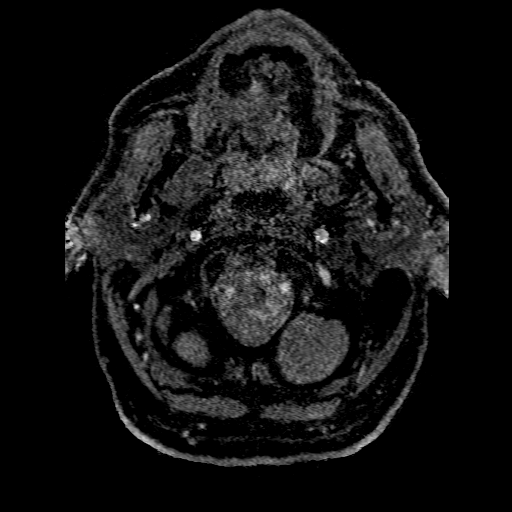
[im 8/176]
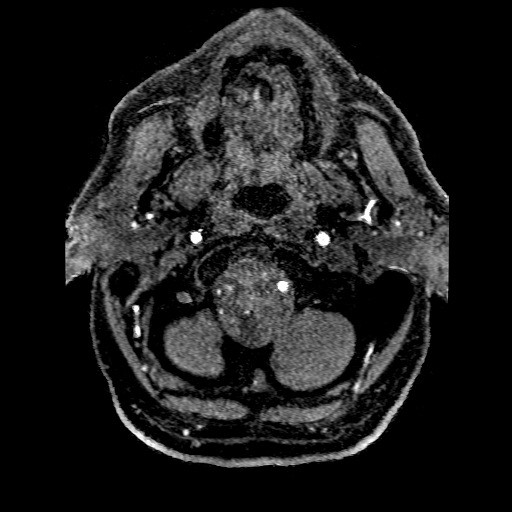
[im 12/176]
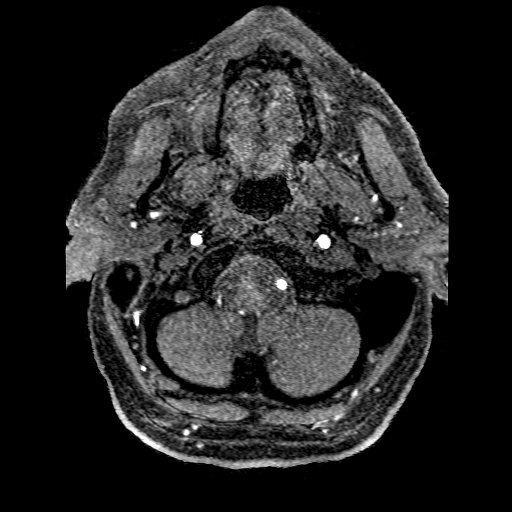
[im 15/176]
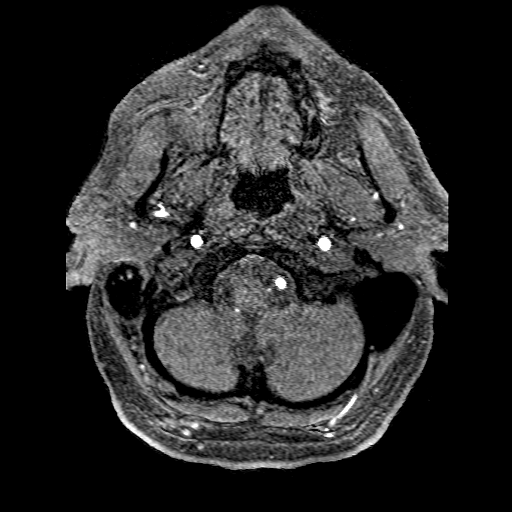
[im 19/176]
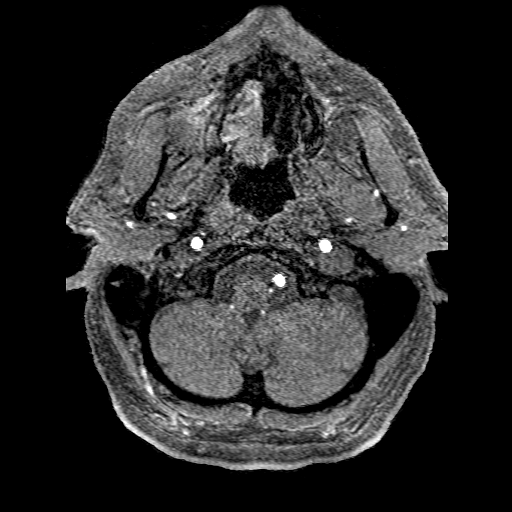
[im 23/176]
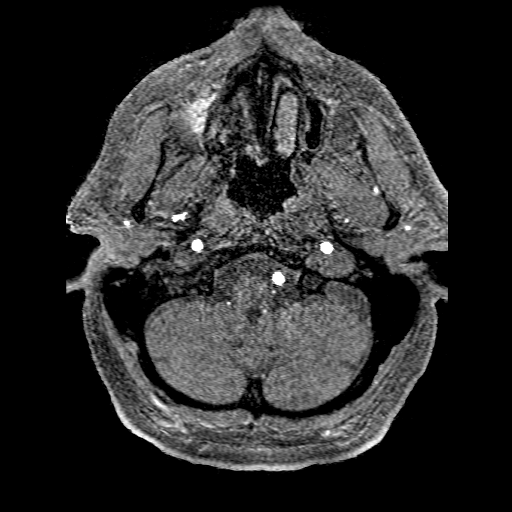
[im 27/176]
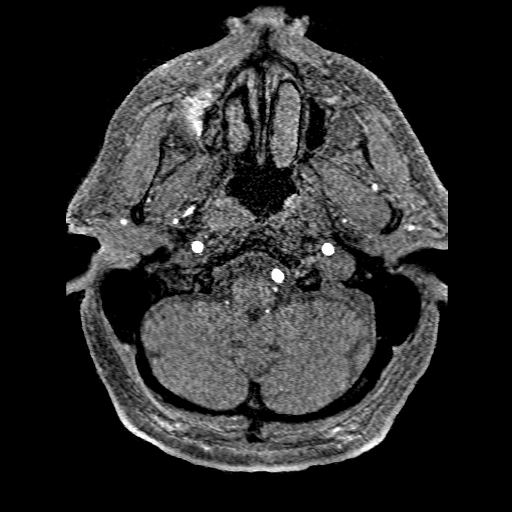
[im 30/176]
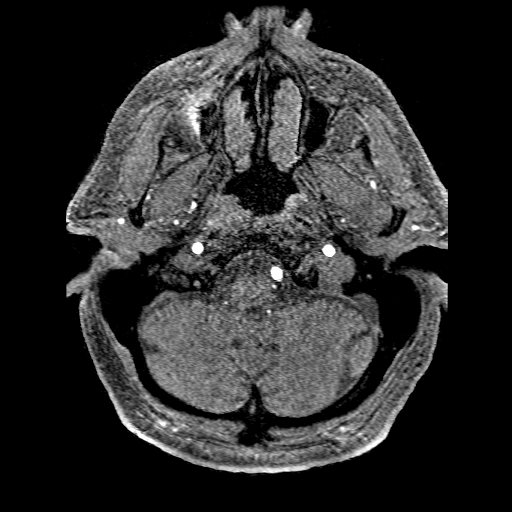
[im 34/176]
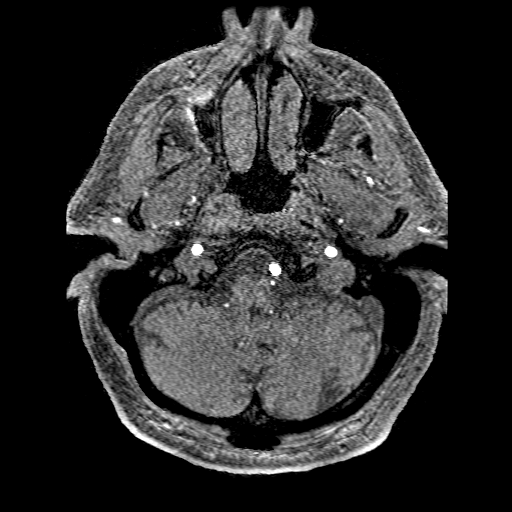
[im 38/176]
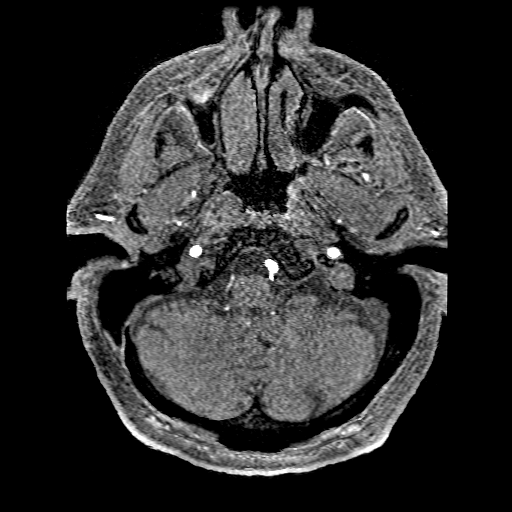
[im 56/176]
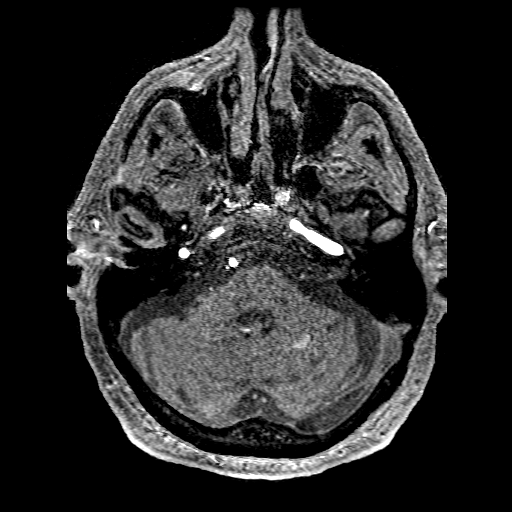
[im 79/176]
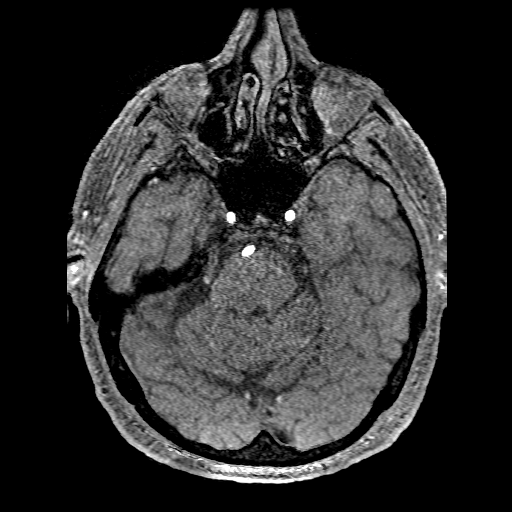
[im 90/176]
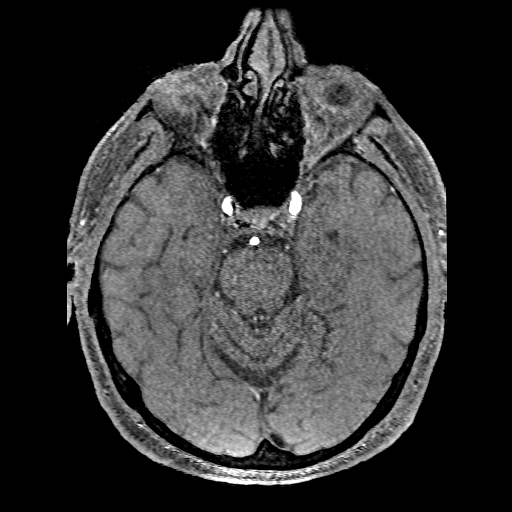
[im 101/176]
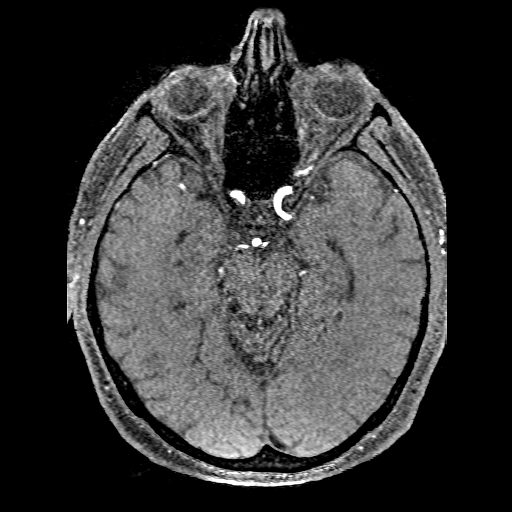
[im 123/176]
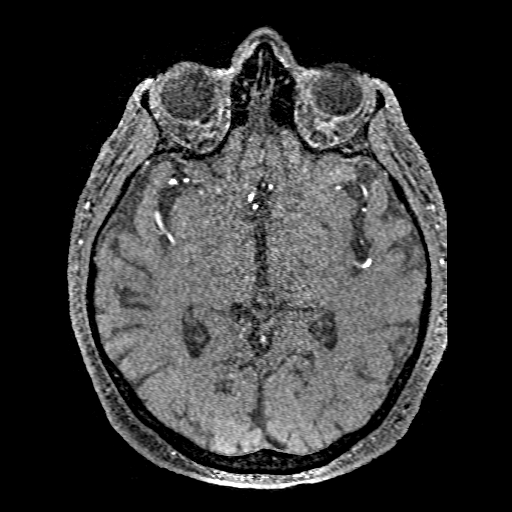
[im 146/176]
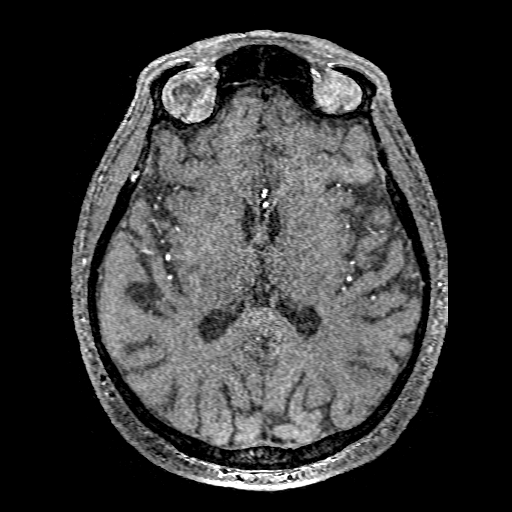
[im 149/176]
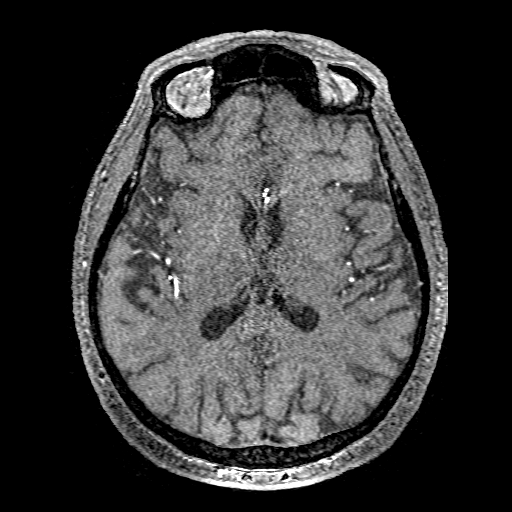
[im 168/176]
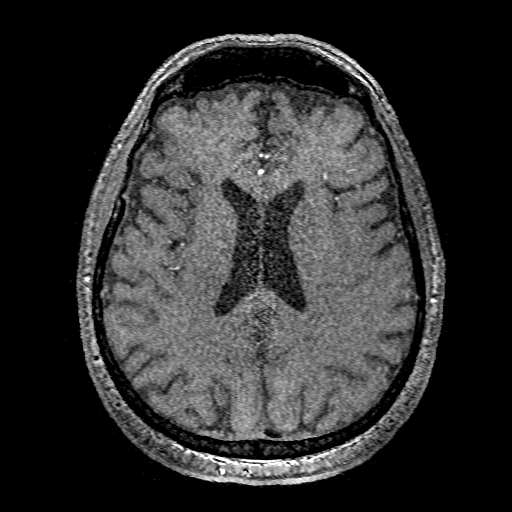

[19 of 48 positions shown; findings below may reference images not displayed]

FINDINGS: The internal carotid arteries are within normal limits from the high
cervical segments through the ICA termini bilaterally. The A1 and M1
segments are normal. The anterior communicating artery is patent.
The MCA bifurcations are within normal limits. ACA and MCA branch
vessels are unremarkable.

The left vertebral artery is dominant. The right vertebral artery is
hypoplastic and terminates at the PICA. Left PICA origin is
visualized and normal. The right AICA is dominant. The basilar
artery is within normal limits. Both posterior cerebral arteries
originate from the basilar tip. The PCA branch vessels are within
normal limits.
IMPRESSION: Normal variant MRA circle of Willis without significant proximal
stenosis, aneurysm, or branch vessel occlusion. No focal lesion to
account for the acute infarct.

## 2018-11-13 ENCOUNTER — Encounter: Payer: Self-pay | Admitting: Internal Medicine
